# Patient Record
Sex: Female | Born: 1955 | Race: Black or African American | Hispanic: No | Marital: Single | Smoking: Never smoker
Health system: Southern US, Community
[De-identification: ages and names within clinical notes are randomized; demographics above are authoritative.]

## PROBLEM LIST (undated history)

## (undated) DIAGNOSIS — E785 Hyperlipidemia, unspecified: Secondary | ICD-10-CM

## (undated) DIAGNOSIS — E119 Type 2 diabetes mellitus without complications: Secondary | ICD-10-CM

## (undated) DIAGNOSIS — I10 Essential (primary) hypertension: Secondary | ICD-10-CM

## (undated) DIAGNOSIS — I2510 Atherosclerotic heart disease of native coronary artery without angina pectoris: Secondary | ICD-10-CM

## (undated) HISTORY — PX: CARDIAC CATHETERIZATION: SHX172

## (undated) HISTORY — PX: PARTIAL HYSTERECTOMY: SHX80

## (undated) HISTORY — DX: Hyperlipidemia, unspecified: E78.5

## (undated) HISTORY — DX: Type 2 diabetes mellitus without complications: E11.9

## (undated) HISTORY — PX: DILATION AND CURETTAGE OF UTERUS: SHX78

## (undated) HISTORY — DX: Atherosclerotic heart disease of native coronary artery without angina pectoris: I25.10

## (undated) HISTORY — DX: Essential (primary) hypertension: I10

---

## 1998-04-07 ENCOUNTER — Emergency Department (HOSPITAL_COMMUNITY): Admission: EM | Admit: 1998-04-07 | Discharge: 1998-04-07 | Payer: Self-pay | Admitting: Emergency Medicine

## 2005-11-07 ENCOUNTER — Ambulatory Visit: Payer: Self-pay

## 2005-11-28 ENCOUNTER — Other Ambulatory Visit: Payer: Self-pay

## 2005-12-02 ENCOUNTER — Ambulatory Visit: Payer: Self-pay | Admitting: Obstetrics and Gynecology

## 2006-04-15 ENCOUNTER — Emergency Department (HOSPITAL_COMMUNITY): Admission: EM | Admit: 2006-04-15 | Discharge: 2006-04-15 | Payer: Self-pay | Admitting: Emergency Medicine

## 2006-12-18 ENCOUNTER — Ambulatory Visit: Payer: Self-pay

## 2009-05-15 DIAGNOSIS — E1165 Type 2 diabetes mellitus with hyperglycemia: Secondary | ICD-10-CM | POA: Insufficient documentation

## 2009-05-15 DIAGNOSIS — E119 Type 2 diabetes mellitus without complications: Secondary | ICD-10-CM

## 2009-12-26 ENCOUNTER — Ambulatory Visit: Payer: Self-pay | Admitting: Family Medicine

## 2009-12-26 DIAGNOSIS — IMO0002 Reserved for concepts with insufficient information to code with codable children: Secondary | ICD-10-CM | POA: Insufficient documentation

## 2009-12-26 DIAGNOSIS — Z8249 Family history of ischemic heart disease and other diseases of the circulatory system: Secondary | ICD-10-CM | POA: Insufficient documentation

## 2009-12-26 DIAGNOSIS — E118 Type 2 diabetes mellitus with unspecified complications: Secondary | ICD-10-CM

## 2009-12-26 DIAGNOSIS — R079 Chest pain, unspecified: Secondary | ICD-10-CM | POA: Insufficient documentation

## 2009-12-26 DIAGNOSIS — E1165 Type 2 diabetes mellitus with hyperglycemia: Secondary | ICD-10-CM

## 2010-01-15 ENCOUNTER — Ambulatory Visit: Payer: Self-pay | Admitting: Internal Medicine

## 2010-01-16 LAB — CONVERTED CEMR LAB
BUN: 17 mg/dL (ref 6–23)
CO2: 20 meq/L (ref 19–32)
Chloride: 101 meq/L (ref 96–112)
Creatinine, Ser: 0.84 mg/dL (ref 0.40–1.20)
INR: 0.95 (ref <1.50)

## 2010-01-18 ENCOUNTER — Encounter: Payer: Self-pay | Admitting: Internal Medicine

## 2010-01-23 ENCOUNTER — Ambulatory Visit: Payer: Self-pay | Admitting: Cardiology

## 2010-01-23 ENCOUNTER — Ambulatory Visit (HOSPITAL_COMMUNITY): Admission: AD | Admit: 2010-01-23 | Discharge: 2010-01-24 | Payer: Self-pay | Admitting: Cardiology

## 2010-01-23 ENCOUNTER — Ambulatory Visit: Payer: Self-pay | Admitting: Internal Medicine

## 2010-01-23 ENCOUNTER — Inpatient Hospital Stay (HOSPITAL_BASED_OUTPATIENT_CLINIC_OR_DEPARTMENT_OTHER): Admission: RE | Admit: 2010-01-23 | Discharge: 2010-01-23 | Payer: Self-pay | Admitting: Internal Medicine

## 2010-01-24 DIAGNOSIS — I2510 Atherosclerotic heart disease of native coronary artery without angina pectoris: Secondary | ICD-10-CM

## 2010-02-06 ENCOUNTER — Telehealth: Payer: Self-pay | Admitting: Internal Medicine

## 2010-02-08 ENCOUNTER — Encounter: Payer: Self-pay | Admitting: Internal Medicine

## 2010-02-14 ENCOUNTER — Telehealth: Payer: Self-pay | Admitting: Internal Medicine

## 2010-02-15 ENCOUNTER — Ambulatory Visit: Payer: Self-pay | Admitting: Internal Medicine

## 2010-02-15 DIAGNOSIS — I10 Essential (primary) hypertension: Secondary | ICD-10-CM | POA: Insufficient documentation

## 2010-02-15 DIAGNOSIS — E782 Mixed hyperlipidemia: Secondary | ICD-10-CM

## 2010-02-15 DIAGNOSIS — I2510 Atherosclerotic heart disease of native coronary artery without angina pectoris: Secondary | ICD-10-CM

## 2010-03-12 ENCOUNTER — Encounter: Payer: Self-pay | Admitting: Internal Medicine

## 2010-10-17 ENCOUNTER — Ambulatory Visit
Admission: RE | Admit: 2010-10-17 | Discharge: 2010-10-17 | Payer: Self-pay | Source: Home / Self Care | Attending: Internal Medicine | Admitting: Internal Medicine

## 2010-10-17 ENCOUNTER — Encounter: Payer: Self-pay | Admitting: Internal Medicine

## 2010-10-25 NOTE — Miscellaneous (Signed)
Summary: No Show  No Show   Imported By: Harlon Flor 03/14/2010 08:40:12  _____________________________________________________________________  External Attachment:    Type:   Image     Comment:   External Document

## 2010-10-25 NOTE — Progress Notes (Signed)
Summary: HIVES  Phone Note Call from Patient Call back at Home Phone [phone]   Caller: SELF Call For: Kelly Manning Summary of Call: PT WOULD LIKE TO KNOW IF SHE CAN COME OFF OF HER MEDS UNTIL HER APPT WITH [facility] TOMORROW BECAUSE SHE HAS HAD HIVES FOR THE PAST 4 DAYS Initial call taken by: Harlon Flor,  Feb 14, 2010 2:04 PM  Follow-up for Phone Call        Recently started on ASA, Plavix, Imdur, simvastatin.  Pt states rash developed on arm 2 days after d/c, last 4 days itching, rash on hands, fingers, face, and arms.  Advised pt against stopping meds.  Per Dr Mariah Milling pt can take some Benedryl as needed and Pepcid as needed to help relieve symptoms.  Dr Mariah Milling suggested maybe switching the Plavix to Effient and/or the simvastatin to Crestor to see if either med is causing rash.  Will let Dr Gala Romney address at [facility] tomorrow.   Follow-up by: Cloyde Reams RN,  Feb 14, 2010 5:17 PM     Appended Document: HIVES Plavix stopped. Switched to [employer]

## 2010-10-25 NOTE — Assessment & Plan Note (Signed)
Summary: NEW PT   Referring Provider:  Dr. Katrinka Blazing Primary Provider:  Reuben Likes  CC:  Chest tightness x 4 days ago; SOB.  History of Present Illness: 55 y/o woman with h/o DM2 x 6 years but no h/o HTN or hyperlipidemia.  No known h/o heart disease. Never had stress test or cath.  Over past 3 months, notes that when she walks gets a tightness in her chest. Once she sits down it oges away in about 5 minutes. Happens about 1-3x/week. Happens only when she is walking. Never at rest. Feels she is more short of breath with activity. CP now seems to be coming on earlier then before. Has not had nocturnal angina. No bleeding, melena or BRBPR.   Has 11 siblings. 1 brother had CAD with stent at 46. Another brother had MI in 41s. 1 sister died of MI at age 19. Mother also had CAD.  Current Medications (verified): 1)  Lisinopril 5 Mg Tabs (Lisinopril) .... Take One Tablet By Mouth Daily 2)  Janumet 50-1000 Mg Tabs (Sitagliptin-Metformin Hcl) .... Take 1 By Mouth Two Times A Day 3)  Aspirin 81 Mg Tbec (Aspirin) .... Take One Tablet By Mouth Daily  Allergies (verified): No Known Drug Allergies  Past History:  Past Medical History: Last updated: Jan 10, 2010 Diabetes Type 2  Past Surgical History: Last updated: 01/10/10 partial hysterectomy D&C  Family History: Last updated: Jan 10, 2010 Father: deceased 65: sucide Mother: deceased 1: DM, ovarian cancer, CAD stents  Social History: Last updated: 01/10/10 Full Time Single  Tobacco Use - No.  Alcohol Use - no Regular Exercise - yes Drug Use - no  Risk Factors: Exercise: yes (01/10/10)  Risk Factors: Smoking Status: never (2010-01-10)  Family History: Reviewed history from 01-10-10 and no changes required. Father: deceased 19: sucide Mother: deceased 16: DM, ovarian cancer, CAD stents  Social History: Reviewed history from 01/10/2010 and no changes required. Full Time Single  Tobacco Use - No.  Alcohol Use -  no Regular Exercise - yes Drug Use - no  Review of Systems       As per HPI and past medical history; otherwise all systems negative.   Vital Signs:  Patient profile:   55 year old female Height:      66 inches Weight:      185 pounds BMI:     29.97 Pulse rate:   96 / minute Pulse rhythm:   regular BP sitting:   118 / 78  (left arm)  Vitals Entered By: Stanton Kidney, EMT-P (January 15, 2010 2:58 PM)  Physical Exam  General:  Gen: well appearing. no resp difficulty HEENT: normal Neck: supple. no JVD. Carotids 2+ bilat; no bruits. No lymphadenopathy or thryomegaly appreciated. Cor: PMI nondisplaced. Regular rate & rhythm. No rubs, gallops, murmur. Lungs: clear Abdomen: soft, nontender, nondistended. No hepatosplenomegaly. No bruits or masses. Good bowel sounds. Extremities: no cyanosis, clubbing, rash, edema Neuro: alert & orientedx3, cranial nerves grossly intact. moves all 4 extremities w/o difficulty. affect pleasant    Impression & Recommendations:  Problem # 1:  CHEST PAIN-UNSPECIFIED (ICD-786.50) Assessment Unchanged Her symptoms are very concerning for prgressive angina - especially in light of her risk factors and Fhx.  We have discussed stress testing vs cath and I have strongly recommended cath. We went over the risks and she agrees to proceed. Will schedule for next week.  Will continue ASA. Start Imdur. We discussed that if she has worsening symptoms need to call 911. Start zocor 20.  Other Orders: T-CBC No Diff ([id number]) T-Basic Metabolic Panel ([id number]) T-Protime, Auto ([id number]) Cardiac Catheterization (Cardiac Cath)  Patient Instructions: 1)  Your physician recommends that you schedule a follow-up appointment in: 1 month 2)  Your physician has recommended you make the following change in your medication: aspirin 325 mg, imdur 30 mg daily, simvastatin 20 mg daily 3)  Your physician has requested that you have a cardiac catheterization.  Cardiac  catheterization is used to diagnose and/or treat various heart conditions. Doctors may recommend this procedure for a number of different reasons. The most common reason is to evaluate chest pain. Chest pain can be a symptom of coronary artery disease (CAD), and cardiac catheterization can show whether plaque is narrowing or blocking your heart's arteries. This procedure is also used to evaluate the valves, as well as measure the blood flow and oxygen levels in different parts of your heart.  For further information please visit [email or web address]  Please follow instruction sheet, as given. Prescriptions: SIMVASTATIN 20 MG TABS (SIMVASTATIN) 1 tab daily at bedtime  #30 x 6   Entered by:   Charlena Cross, RN, BSN   Authorized by:   Dolores Patty, MD, [facility]   Signed by:   Charlena Cross, RN, BSN on 01/15/2010   Method used:   [facility] to        [facility] [phone]* (retail)       [address] [place], [place]  [address]       Ph: [phone] or [phone]       Fax: [phone]   RxID:   [id number] IMDUR 30 MG XR24H-TAB (ISOSORBIDE MONONITRATE) 1 tab by mouth daily  #30 x 6   Entered by:   Charlena Cross, RN, BSN   Authorized by:   Dolores Patty, MD, [facility]   Signed by:   Charlena Cross, RN, BSN on 01/15/2010   Method used:   Electronically to        [facility] [phone]* (retail)       [address] [place], [place]  [address]       Ph: [phone] or [phone]       Fax: [phone]   RxID:   [id number]

## 2010-10-25 NOTE — Progress Notes (Signed)
Summary: Return to work  Phone Note Call from Patient   Caller: Patient Call For: Bensimhon Summary of Call: Pt dropped off paperwork for her job.  Pt has to be cleared by Dr Gala Romney and MD at her work before returning to her job at [employer].  Pt feels she is ready and able to return to her job on 02/12/10, but needs paperwork signed stating OK with Dr Gala Romney to return to work.  Please advise if OK to return to work on 02/12/10.  Pt needs to know by 5/19/11so that she can schedule CPX with MD at [facility].  Please advise.  Thanks. Initial call taken by: Cloyde Reams RN,  Feb 06, 2010 4:14 PM  Follow-up for Phone Call        Need to understand job duties before we can clear fully. should be ok though. 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 Please advise if OK to return to work.  Follow-up by: Cloyde Reams RN,  Feb 07, 2010 11:40 AM  Additional Follow-up for Phone Call Additional follow up Details #1::        ok to go back to work. Dolores Patty, MD, [facility]  Feb 07, 2010 6:11 PM   Called spoke with pt, made aware OK per Dr Gala Romney to return to work on 02/12/10.  Will complete paperwork for pt to pick-up.  Additional Follow-up by: Cloyde Reams RN,  Feb 08, 2010 8:41 AM

## 2010-10-25 NOTE — Assessment & Plan Note (Signed)
Summary: Kelly Manning   Visit Type:  Follow-up Referring Provider:  Dr. Katrinka Blazing Primary Provider:  Reuben Likes  CC:  no cardiac complaints.  History of Present Illness: 55 y/o woman with h/o DM2 x 6 years but no h/o HTN or hyperlipidemia.  I recently saw her in consult for CP and she underwent cardiac cath which showed EF 55% with 90-95% lesion in LAD. RCA and LCX normal. Underwent Promus DES to LAD and balloon angioplasty of the diagonal.  Returns for post-cath f/u.   Doing very well.  CP and dyspnea resolved. No problems with groin site. Back to work without difficulty. Have hives and pruritic rash and relates it to Plavix. [facility].   Current Medications (verified): 1)  Lisinopril 5 Mg Tabs (Lisinopril) .... Take One Tablet By Mouth Daily 2)  Janumet 50-1000 Mg Tabs (Sitagliptin-Metformin Hcl) .... Take 1 By Mouth Two Times A Day 3)  Aspirin Ec 325 Mg Tbec (Aspirin) .... Take One Tablet By Mouth Daily 4)  Imdur 30 Mg Xr24h-Tab (Isosorbide Mononitrate) .Marland Kitchen.. 1 Tab By Mouth Daily 5)  Simvastatin 20 Mg Tabs (Simvastatin) .Marland Kitchen.. 1 Tab Daily At Bedtime 6)  Plavix 75 Mg Tabs (Clopidogrel Bisulfate) .... Take One Tablet By Mouth Daily  Allergies (verified): 1)  ! Plavix (Clopidogrel Bisulfate)  Vital Signs:  Patient profile:   55 year old female Height:      66 inches Weight:      187 pounds BMI:     30.29 Pulse rate:   85 / minute BP sitting:   146 / 80  (left arm) Cuff size:   regular  Vitals Entered By: Kelly Manning, RMA (Feb 15, 2010 3:50 PM)  Physical Exam  General:  Gen: well appearing. no resp difficulty HEENT: normal Neck: supple. no JVD. Carotids 2+ bilat; no bruits. No lymphadenopathy or thryomegaly appreciated. Cor: PMI nondisplaced. Regular rate & rhythm. No rubs, gallops, 2/6 murmur at RSB. Lungs: clear Abdomen: soft, nontender, nondistended. No hepatosplenomegaly. No bruits or masses. Good bowel sounds. Extremities: no cyanosis,  clubbing, edema. macular rash on arm. groin ok without bruit Neuro: alert & orientedx3, cranial nerves grossly intact. moves all 4 extremities w/o difficulty. affect pleasant    Impression & Recommendations:  Problem # 1:  CAD, NATIVE VESSEL (ICD-414.01) Doing well s/p recent stenting of LAD. Having allergic reaction likely to Plavix. Will change to Effient 10 once daily. Encouraged her to f/u with cardiac rehab  Problem # 2:  HYPERTENSION, BENIGN (ICD-401.1) BP is up today which is unusual for her. If SBP remains > 140 I asked her to increase her lisinopril to 10. F/u with Dr. Katrinka Blazing.   Appended Document: Kelly Manning    Clinical Lists Changes  Medications: Removed medication of PLAVIX 75 MG TABS (CLOPIDOGREL BISULFATE) Take one tablet by mouth daily Added new medication of EFFIENT 10 MG TABS (PRASUGREL HCL) Take one tablet by mouth daily - Signed Rx of EFFIENT 10 MG TABS (PRASUGREL HCL) Take one tablet by mouth daily;  #30 x 6;  Signed;  Entered by: Cloyde Reams RN;  Authorized by: Dolores Patty, MD, [facility];  Method used: [facility] to [facility] [phone]*, [address] [place], [place], [place]  [address], Ph: [phone] or [phone], Fax: [phone]    Prescriptions: EFFIENT 10 MG TABS (PRASUGREL HCL) Take one tablet by mouth daily  #30 x 6   Entered by:   Cloyde Reams RN   Authorized by:  Dolores Patty, MD, [facility]   Signed by:   Cloyde Reams RN on 02/15/2010   Method used:   Electronically to        [facility] [phone]* (retail)       [address] [place], [place]  [address]       Ph: [phone] or [phone]       Fax: [phone]   RxID:   [id number]

## 2010-10-25 NOTE — Progress Notes (Signed)
Summary: PHI  PHI   Imported By: Harlon Flor 01/16/2010 15:04:08  _____________________________________________________________________  External Attachment:    Type:   Image     Comment:   External Document

## 2010-10-25 NOTE — Letter (Signed)
Summary: FMLA  FMLA   Imported By: Harlon Flor 02/08/2010 12:00:40  _____________________________________________________________________  External Attachment:    Type:   Image     Comment:   External Document

## 2010-10-25 NOTE — Assessment & Plan Note (Signed)
Summary: F6M/SAB   Visit Type:  Follow-up Referring Provider:  Dr. Katrinka Blazing Primary Provider:  Reuben Likes  CC:  "Doing well"..  History of Present Illness: Kelly Manning is a 55 y/o woman with h/o DM2 x 6 years but no h/o HTN or hyperlipidemia.  I recently saw her in consult for CP and she underwent cardiac cath in 5/11 which showed EF 55% with 90-95% lesion in LAD. RCA and LCX normal. Underwent Promus DES to LAD and balloon angioplasty of the diagonal.  Returns for f/u.   Doing very well.  Not going to cardiac rehab. Walks with her sister every afternoon. Typically 30 mins per day. No CP or undue dyspnea. Had a rash with Plavix and had to switch to Effient. very compliant with it. Does note some bruising but not severe.  Lipids followed by Dr. Katrinka Blazing.   Current Medications (verified): 1)  Lisinopril 5 Mg Tabs (Lisinopril) .... Take One Tablet By Mouth Daily 2)  Janumet 50-1000 Mg Tabs (Sitagliptin-Metformin Hcl) .... Take 1 By Mouth Two Times A Day 3)  Aspirin Ec 325 Mg Tbec (Aspirin) .... Take One Tablet By Mouth Daily 4)  Imdur 30 Mg Xr24h-Tab (Isosorbide Mononitrate) .Marland Kitchen.. 1 Tab By Mouth Daily 5)  Simvastatin 20 Mg Tabs (Simvastatin) .Marland Kitchen.. 1 Tab Daily At Bedtime 6)  Effient 10 Mg Tabs (Prasugrel Hcl) .... Take One Tablet By Mouth Daily  Allergies (verified): 1)  ! Plavix (Clopidogrel Bisulfate)  Past History:  Past Surgical History: Last updated: 01-22-10 partial hysterectomy D&C  Family History: Last updated: 01-22-10 Father: deceased 38: sucide Mother: deceased 54: DM, ovarian cancer, CAD stents  Social History: Last updated: 01-22-10 Full Time Single  Tobacco Use - No.  Alcohol Use - no Regular Exercise - yes Drug Use - no  Risk Factors: Exercise: yes (01-22-2010)  Risk Factors: Smoking Status: never (01/22/10)  Past Medical History: CAD   --s/p DES to LAD in May 2011 Plavix allergy (rash) Diabetes Type 2  Review of Systems       As per HPI and  past medical history; otherwise all systems negative.   Vital Signs:  Patient profile:   55 year old female Height:      66 inches Weight:      188 pounds BMI:     30.45 Pulse rate:   80 / minute BP sitting:   100 / 70  (left arm) Cuff size:   regular  Vitals Entered By: Bishop Dublin, CMA (October 17, 2010 11:05 AM)  Physical Exam  General:  well appearing. no resp difficulty HEENT: normal Neck: supple. no JVD. Carotids 2+ bilat; no bruits. No lymphadenopathy or thryomegaly appreciated. Cor: PMI nondisplaced. Regular rate & rhythm. No rubs, gallops, 2/6 murmur at RSB. Lungs: clear Abdomen: soft, nontender, nondistended. No hepatosplenomegaly. No bruits or masses. Good bowel sounds. Extremities: no cyanosis, clubbing, edema. macular rash on arm. groin ok without bruit Neuro: alert & orientedx3, cranial nerves grossly intact. moves all 4 extremities w/o difficulty. affect pleasant    Impression & Recommendations:  Problem # 1:  CAD, NATIVE VESSEL (ICD-414.01) Doing very well. No angina. Continue Effient for 1 year (May). Can decrease ASA to 81. Stop Imdure.  Problem # 2:  HYPERLIPIDEMIA TYPE IIB / III (ICD-272.2) Followed by Dr. Katrinka Blazing. Goal LDL < 70. Titrate as needed.  Patient Instructions: 1)  Your physician recommends that you schedule a follow-up appointment in: 6 months 2)  Your physician has recommended you make the following change in your medication:  STOP Imdur. DECREASE Aspirin to 81mg  once daily.

## 2010-10-25 NOTE — Letter (Signed)
Summary: Cardiac Catheterization Instructions- [facility]  [facility] at [address]   [place], [place] [address]   Phone: [phone]  Fax: [phone]     01/15/2010 MRN: [id number]  [facility] [address] [place], [place]  [address]  Dear Ms. Svec,   You are scheduled for a Cardiac Catheterization on 01/23/10 with Dr.Bret Stamour.   Please arrive to the 1st floor of the Heart and Vascular Center at [facility] at 7:30 am on the day of your procedure. Please do not arrive before 6:30 a.m. Call the Heart and Vascular Center at [phone] if you are unable to make your appointmnet. The Code to get into the parking garage under the building is 0010. Take the elevators to the 1st floor. You must have someone to drive you home. Someone must be with you for the first 24 hours after you arrive home. Please wear clothes that are easy to get on and off and wear slip-on shoes. Do not eat or drink after midnight except water with your medications that morning. Bring all your medications and current insurance cards with you.  ___ DO NOT take these medications before your procedure: ________________________________________________________________  ___ Make sure you take your aspirin.  ___ You may take ALL of your medications with water that morning. ________________________________________________________________________________________________________________________________  ___ DO NOT take ANY medications before your procedure.  ___ Pre-med instructions:  ________________________________________________________________________________________________________________________________  The usual length of stay after your procedure is 2 to 3 hours. This can vary.  If you have any questions, please call the office at the number listed above.   Charlena Cross, RN, BSN

## 2010-12-11 LAB — GLUCOSE, CAPILLARY
Glucose-Capillary: 151 mg/dL — ABNORMAL HIGH (ref 70–99)
Glucose-Capillary: 161 mg/dL — ABNORMAL HIGH (ref 70–99)
Glucose-Capillary: 180 mg/dL — ABNORMAL HIGH (ref 70–99)
Glucose-Capillary: 210 mg/dL — ABNORMAL HIGH (ref 70–99)

## 2010-12-11 LAB — BASIC METABOLIC PANEL
CO2: 24 mEq/L (ref 19–32)
Calcium: 9.1 mg/dL (ref 8.4–10.5)
Creatinine, Ser: 0.74 mg/dL (ref 0.4–1.2)
GFR calc Af Amer: >60 mL/min (ref >60)

## 2010-12-11 LAB — CBC
MCHC: 34.5 g/dL (ref 30.0–36.0)
RBC: 4.33 MIL/uL (ref 3.87–5.11)

## 2011-03-20 ENCOUNTER — Ambulatory Visit: Payer: Self-pay | Admitting: Family Medicine

## 2011-03-20 LAB — HM MAMMOGRAPHY

## 2011-04-17 ENCOUNTER — Encounter: Payer: Self-pay | Admitting: Internal Medicine

## 2011-04-24 ENCOUNTER — Encounter: Payer: Self-pay | Admitting: Internal Medicine

## 2011-05-06 LAB — HM COLONOSCOPY

## 2011-05-08 ENCOUNTER — Encounter: Payer: Self-pay | Admitting: *Deleted

## 2011-05-08 ENCOUNTER — Ambulatory Visit (INDEPENDENT_AMBULATORY_CARE_PROVIDER_SITE_OTHER): Payer: BC Managed Care – PPO | Admitting: Internal Medicine

## 2011-05-08 ENCOUNTER — Encounter: Payer: Self-pay | Admitting: Internal Medicine

## 2011-05-08 VITALS — BP 132/84 | HR 83 | Ht 66.0 in | Wt 186.0 lb

## 2011-05-08 DIAGNOSIS — R0989 Other specified symptoms and signs involving the circulatory and respiratory systems: Secondary | ICD-10-CM

## 2011-05-08 DIAGNOSIS — E782 Mixed hyperlipidemia: Secondary | ICD-10-CM

## 2011-05-08 DIAGNOSIS — R0600 Dyspnea, unspecified: Secondary | ICD-10-CM

## 2011-05-08 DIAGNOSIS — I2510 Atherosclerotic heart disease of native coronary artery without angina pectoris: Secondary | ICD-10-CM

## 2011-05-08 NOTE — Progress Notes (Signed)
HPI:  Kelly Manning is a 55 y/o woman with h/o DM2 and CAD. Underwent cardiac cath in 5/11 for CP which showed EF 55% with 90-95% lesion in LAD. RCA and LCX normal. Underwent Promus DES to LAD and balloon angioplasty of the diagonal.  Returns for f/u.   Doing fairly well. Stopped Effient (had rash with Plavix) in June. Notes if she bends over to tie her shoe or walks up steps gets SOB. However, continues to walk with her sister 2-3x/week without CP. Also can push mow her lawn without CP. No wheezing or edema. No weight gain.    Lipids followed by Dr. Katrinka Blazing - told her numbers were outstanding.     ROS: All systems negative except as listed in HPI, PMH and Problem List.  Past Medical History  Diagnosis Date  . CAD (coronary artery disease)     -s/p DES to LAD in May 2011  . DM2 (diabetes mellitus, type 2)     Current Outpatient Prescriptions  Medication Sig Dispense Refill  . aspirin 81 MG tablet Take 81 mg by mouth daily.        Marland Kitchen glipiZIDE (GLUCOTROL) 5 MG tablet Take 5 mg by mouth 2 (two) times daily before a meal.        . lisinopril (PRINIVIL,ZESTRIL) 5 MG tablet Take 5 mg by mouth daily.        . simvastatin (ZOCOR) 20 MG tablet Take 20 mg by mouth at bedtime.        . sitaGLIPtan-metformin (JANUMET) 50-1000 MG per tablet Take 1 tablet by mouth 2 (two) times daily.           PHYSICAL EXAM: Filed Vitals:   05/08/11 1416  BP: 132/84  Pulse: 83   General:  Well appearing. No resp difficulty HEENT: normal Neck: supple. JVP flat. Carotids 2+ bilaterally; no bruits. No lymphadenopathy or thryomegaly appreciated. Cor: PMI normal. Regular rate & rhythm. No rubs, gallops or murmurs. Lungs: clear Abdomen: soft, nontender, nondistended. No hepatosplenomegaly. No bruits or masses. Good bowel sounds. Extremities: no cyanosis, clubbing, rash, edema Neuro: alert & orientedx3, cranial nerves grossly intact. Moves all 4 extremities w/o difficulty. Affect pleasant.    ECG: NSR 78 No ST-T  wave abnormalities.     ASSESSMENT & PLAN:

## 2011-05-08 NOTE — Assessment & Plan Note (Signed)
Overall seems to be doing well but does have mild intermittent dyspnea which I suspect is related to a decline in her fitness. However, given recent LAD stent would favor ETT/Myoview to exclude recurrent ischemia. Otherwise continue current regimen. I encouraged her to do a bit more walking on a regular basis.

## 2011-05-08 NOTE — Assessment & Plan Note (Signed)
Followed by Dr. Katrinka Blazing. Goal LDL < 70. Continue statin.

## 2011-05-08 NOTE — Patient Instructions (Signed)
You are scheduled for a stress test myoview at [facility], please refer to instructions given today.

## 2011-05-10 ENCOUNTER — Encounter: Payer: Self-pay | Admitting: *Deleted

## 2011-05-16 ENCOUNTER — Ambulatory Visit: Payer: Self-pay | Admitting: Internal Medicine

## 2011-05-16 DIAGNOSIS — R079 Chest pain, unspecified: Secondary | ICD-10-CM

## 2011-05-21 ENCOUNTER — Telehealth: Payer: Self-pay | Admitting: *Deleted

## 2011-05-21 NOTE — Telephone Encounter (Signed)
Pt called to get myoview results. Notified pt low risk scan, no ischemia noted ,normal wall motion, no ekg changes. Pt will f/u as needed.

## 2011-07-01 ENCOUNTER — Encounter: Payer: Self-pay | Admitting: Internal Medicine

## 2011-07-05 ENCOUNTER — Emergency Department (HOSPITAL_COMMUNITY)
Admission: EM | Admit: 2011-07-05 | Discharge: 2011-07-05 | Disposition: A | Discharge status: Home / Self Care | Payer: Worker's Compensation | Attending: Emergency Medicine | Admitting: Emergency Medicine

## 2011-07-05 ENCOUNTER — Emergency Department (HOSPITAL_COMMUNITY): Payer: Worker's Compensation

## 2011-07-05 DIAGNOSIS — Y9289 Other specified places as the place of occurrence of the external cause: Secondary | ICD-10-CM | POA: Insufficient documentation

## 2011-07-05 DIAGNOSIS — IMO0001 Reserved for inherently not codable concepts without codable children: Secondary | ICD-10-CM | POA: Insufficient documentation

## 2011-07-05 DIAGNOSIS — W319XXA Contact with unspecified machinery, initial encounter: Secondary | ICD-10-CM | POA: Insufficient documentation

## 2011-07-05 DIAGNOSIS — E119 Type 2 diabetes mellitus without complications: Secondary | ICD-10-CM | POA: Insufficient documentation

## 2011-07-05 DIAGNOSIS — S6720XA Crushing injury of unspecified hand, initial encounter: Secondary | ICD-10-CM | POA: Insufficient documentation

## 2011-07-05 DIAGNOSIS — M79609 Pain in unspecified limb: Secondary | ICD-10-CM | POA: Insufficient documentation

## 2011-07-05 DIAGNOSIS — M7989 Other specified soft tissue disorders: Secondary | ICD-10-CM | POA: Insufficient documentation

## 2012-01-13 ENCOUNTER — Other Ambulatory Visit: Payer: Self-pay | Admitting: Internal Medicine

## 2012-01-13 NOTE — Telephone Encounter (Signed)
Pt was last seen in august by Dr Gala Romney and was told to f/u in a year but she is out of her meds. Please send refill to [facility] to get her through to aug appt.

## 2012-01-14 ENCOUNTER — Other Ambulatory Visit: Payer: Self-pay | Admitting: Internal Medicine

## 2012-01-14 MED ORDER — SIMVASTATIN 20 MG PO TABS: 20.0000 mg | ORAL_TABLET | Freq: Every day | ORAL | Status: DC

## 2012-01-14 NOTE — Telephone Encounter (Signed)
Refilled simvastatin (ZOCOR) 20 MG tablet.

## 2012-05-08 ENCOUNTER — Institutional Professional Consult (permissible substitution): Payer: Self-pay | Admitting: Cardiovascular Disease

## 2012-05-15 ENCOUNTER — Encounter: Payer: Self-pay | Admitting: Cardiovascular Disease

## 2012-05-15 ENCOUNTER — Ambulatory Visit (INDEPENDENT_AMBULATORY_CARE_PROVIDER_SITE_OTHER): Payer: BC Managed Care – PPO | Admitting: Cardiovascular Disease

## 2012-05-15 VITALS — BP 118/80 | HR 80 | Ht 66.5 in | Wt 195.5 lb

## 2012-05-15 DIAGNOSIS — E782 Mixed hyperlipidemia: Secondary | ICD-10-CM

## 2012-05-15 DIAGNOSIS — I10 Essential (primary) hypertension: Secondary | ICD-10-CM

## 2012-05-15 DIAGNOSIS — E119 Type 2 diabetes mellitus without complications: Secondary | ICD-10-CM

## 2012-05-15 DIAGNOSIS — I2510 Atherosclerotic heart disease of native coronary artery without angina pectoris: Secondary | ICD-10-CM

## 2012-05-15 MED ORDER — ATORVASTATIN CALCIUM 40 MG PO TABS: 40.0000 mg | ORAL_TABLET | Freq: Every day | ORAL | Status: DC

## 2012-05-15 NOTE — Patient Instructions (Addendum)
You are doing well. When you run out of simvastatin (2 a day), Please change to atorvastatin 40 mg daily  Please call us if you have new issues that need to be addressed before your next appt.  Your physician wants you to follow-up in: 12 months.  You will receive a reminder letter in the mail two months in advance. If you don't receive a letter, please call our office to schedule the follow-up appointment.

## 2012-05-15 NOTE — Assessment & Plan Note (Signed)
Blood pressure is well controlled on today's visit. No changes made to the medications. 

## 2012-05-15 NOTE — Assessment & Plan Note (Signed)
Currently with no symptoms of angina. No further workup at this time. Continue current medication regimen. 

## 2012-05-15 NOTE — Progress Notes (Signed)
Patient ID: Kelly Manning, female    DOB: 1956-01-23, 56 y.o.   MRN: [id number]  HPI Comments: Kelly Manning is a 56 y/o woman with h/o DM2, hyperlipidemia, CAD with stent to her LAD in May 2011, who presents for routine followup.     CP in 2011, cardiac cath  which showed EF 55% with 90-95% lesion in LAD. RCA and LCX normal. Underwent Promus DES to LAD and balloon angioplasty of the diagonal.  Doing very well.  He denies any significant chest pain. No shortness of breath. She works third shift. Exercising sometimes. Sugar levels are "up and down".   Stress test August 2012 showing no perfusion abnormality, low risk scan, normal ejection fraction EKG shows normal sinus rhythm with rate 80 beats per minute with no significant ST or T wave changes Recent lab work shows total cholesterol greater than 170, LDL in the 90 range       Outpatient Encounter Prescriptions as of 05/15/2012  Medication Sig Dispense Refill  . aspirin 81 MG tablet Take 81 mg by mouth daily.        Marland Kitchen glipiZIDE (GLUCOTROL XL) 10 MG 24 hr tablet Take 10 mg by mouth daily.      Marland Kitchen lisinopril (PRINIVIL,ZESTRIL) 5 MG tablet Take 5 mg by mouth daily.        . simvastatin (ZOCOR) 20 MG tablet Take 1 tablet (20 mg total) by mouth at bedtime.  30 tablet  4  . sitaGLIPtan-metformin (JANUMET) 50-1000 MG per tablet Take 1 tablet by mouth 2 (two) times daily.        Marland Kitchen DISCONTD: glipiZIDE (GLUCOTROL) 5 MG tablet Take 5 mg by mouth 2 (two) times daily before a meal.          Review of Systems  Constitutional: Negative.   HENT: Negative.   Eyes: Negative.   Respiratory: Negative.   Cardiovascular: Negative.   Gastrointestinal: Negative.   Musculoskeletal: Negative.   Skin: Negative.   Neurological: Negative.   Hematological: Negative.   Psychiatric/Behavioral: Negative.   All other systems reviewed and are negative.     BP 118/80  Pulse 80  Ht 5' 6.5" (1.689 m)  Wt 195 lb 8 oz (88.678 kg)  BMI 31.08 kg/m2  Physical  Exam  Nursing note and vitals reviewed. Constitutional: She is oriented to person, place, and time. She appears well-developed and well-nourished.  HENT:  Head: Normocephalic.  Nose: Nose normal.  Mouth/Throat: Oropharynx is clear and moist.  Eyes: Conjunctivae are normal. Pupils are equal, round, and reactive to light.  Neck: Normal range of motion. Neck supple. No JVD present.  Cardiovascular: Normal rate, regular rhythm, S1 normal, S2 normal, normal heart sounds and intact distal pulses.  Exam reveals no gallop and no friction rub.   No murmur heard. Pulmonary/Chest: Effort normal and breath sounds normal. No respiratory distress. She has no wheezes. She has no rales. She exhibits no tenderness.  Abdominal: Soft. Bowel sounds are normal. She exhibits no distension. There is no tenderness.  Musculoskeletal: Normal range of motion. She exhibits no edema and no tenderness.  Lymphadenopathy:    She has no cervical adenopathy.  Neurological: She is alert and oriented to person, place, and time. Coordination normal.  Skin: Skin is warm and dry. No rash noted. No erythema.  Psychiatric: She has a normal mood and affect. Her behavior is normal. Judgment and thought content normal.         Assessment and Plan

## 2012-05-15 NOTE — Assessment & Plan Note (Signed)
We have encouraged continued exercise, careful diet management in an effort to lose weight. 

## 2012-05-15 NOTE — Assessment & Plan Note (Signed)
Cholesterol is not at goal. We have suggested she hold her simvastatin and start Lipitor 40 mg daily. Goal LDL less than 70.

## 2012-10-29 LAB — HM HEPATITIS C SCREENING LAB: HM Hepatitis Screen: NEGATIVE

## 2012-12-11 ENCOUNTER — Other Ambulatory Visit: Payer: Self-pay | Admitting: Family Medicine

## 2013-07-14 IMAGING — CR DG HAND COMPLETE 3+V*L*
3 series · 3 of 3 positions shown · non-contrast
Comparison: None.

CLINICAL DATA: Injured left hand, pain localizing to the long
finger and the first MCP joint.

LEFT HAND - COMPLETE 3+ VIEW 07/05/2011:

[x hand pa left]
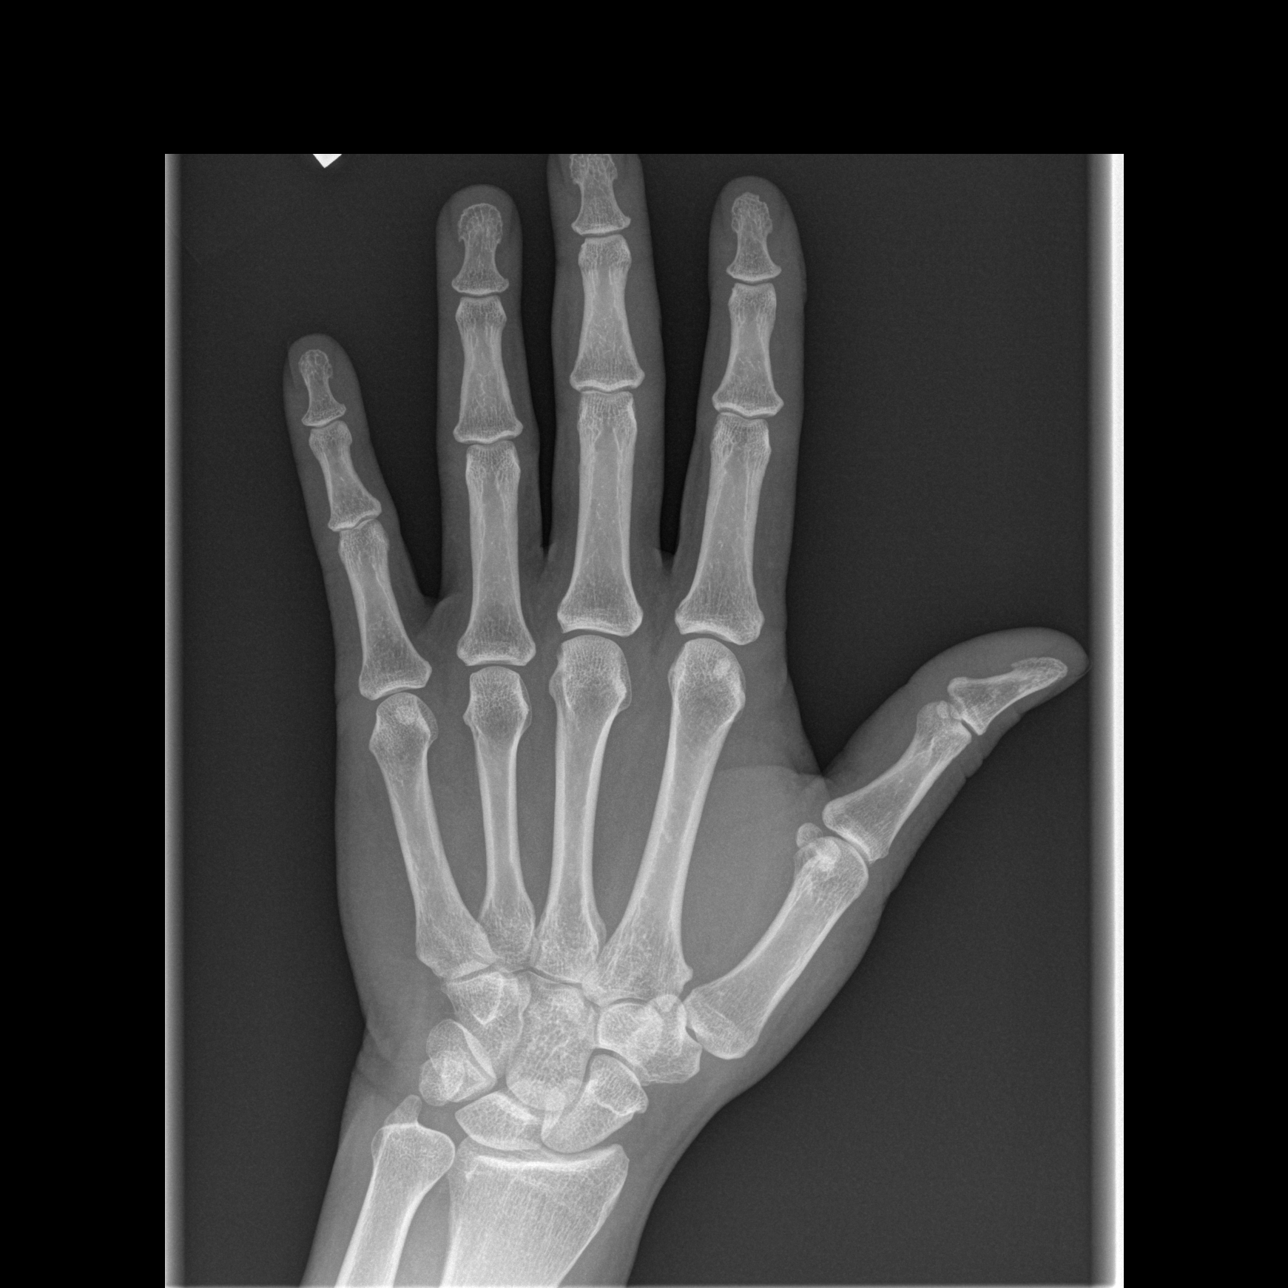

[x hand oblique left]
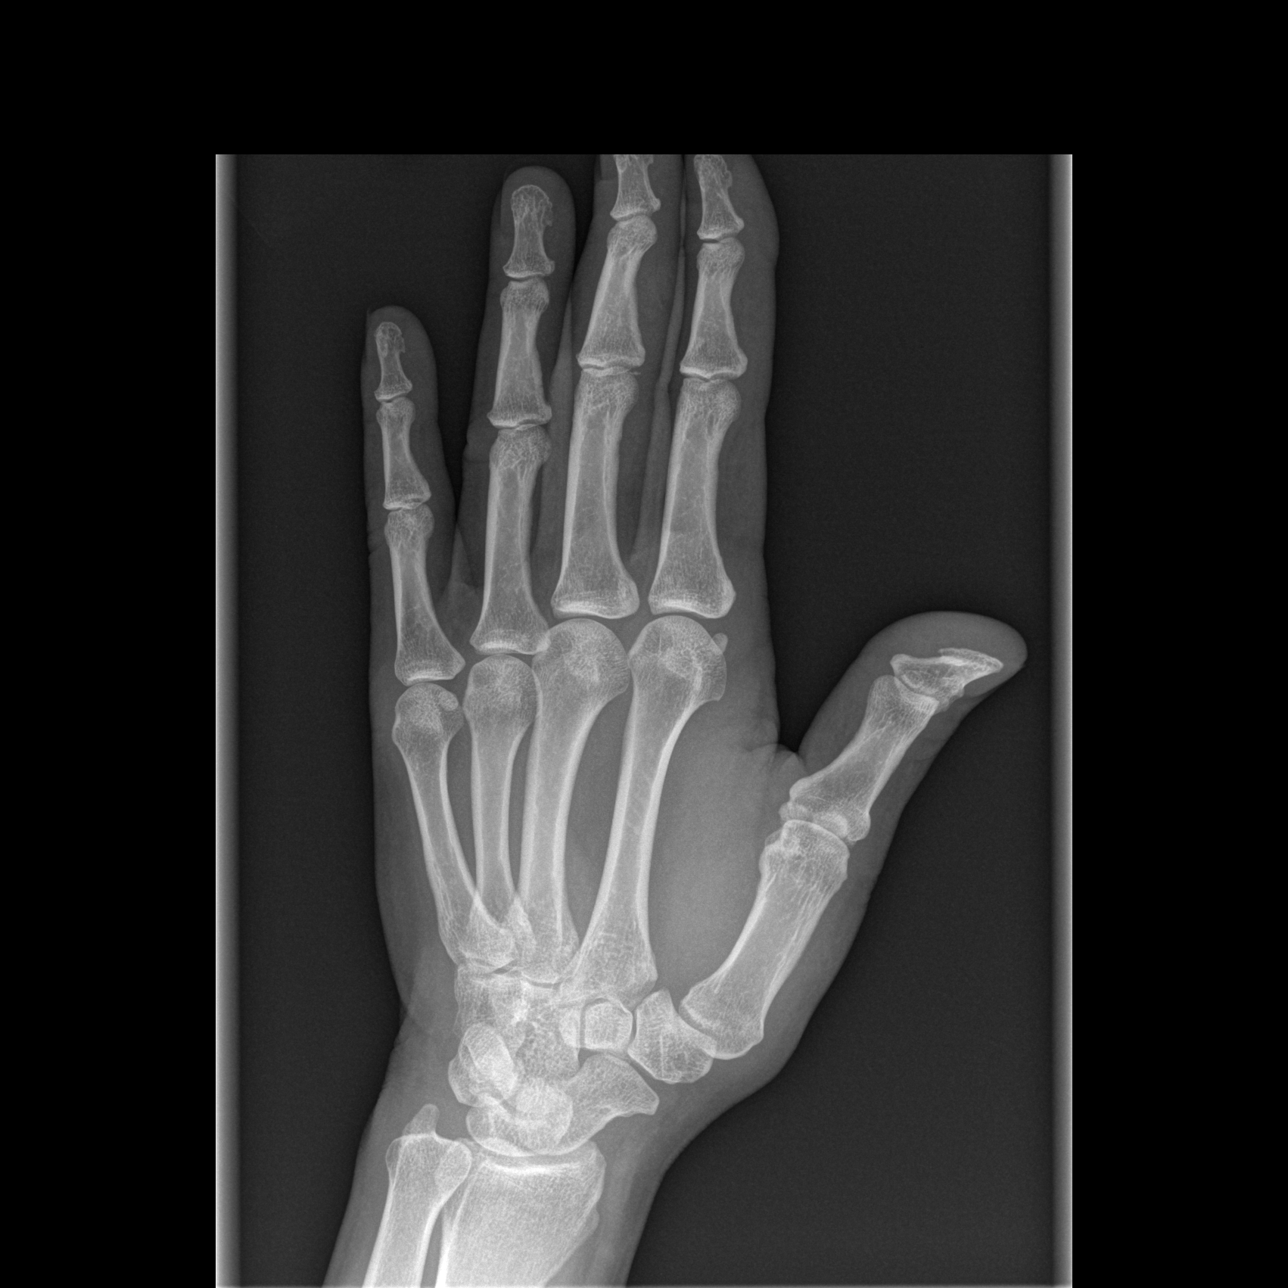

[x hand lat left]
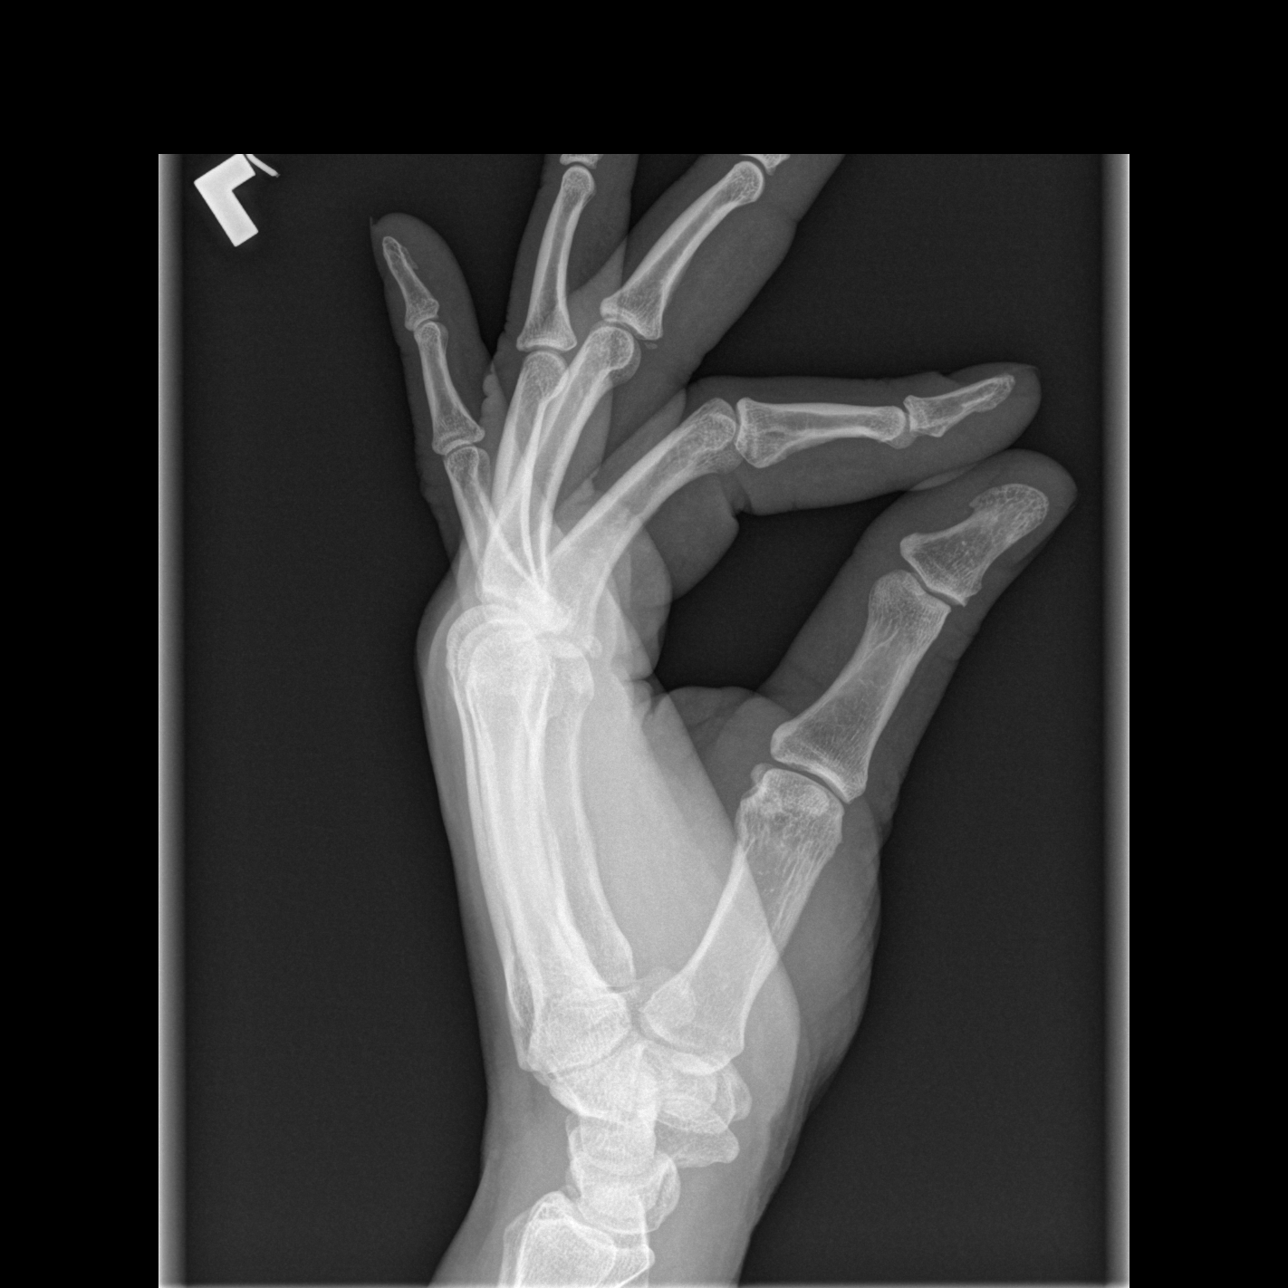

[3 of 3 positions shown; findings below may reference images not displayed]

FINDINGS: Small avulsion fracture arising from the base of the
middle phalanx of the long finger along its volar surface.  No
other fractures.  Well-preserved joint spaces.  Well-preserved bone
mineral density.
IMPRESSION: Volar plate fracture involving the base of the middle phalanx of
the long finger.

## 2013-08-09 ENCOUNTER — Ambulatory Visit: Payer: Self-pay | Admitting: Cardiovascular Disease

## 2013-08-23 ENCOUNTER — Encounter (INDEPENDENT_AMBULATORY_CARE_PROVIDER_SITE_OTHER): Payer: Self-pay

## 2013-08-23 ENCOUNTER — Ambulatory Visit (INDEPENDENT_AMBULATORY_CARE_PROVIDER_SITE_OTHER): Payer: BC Managed Care – PPO | Admitting: Cardiovascular Disease

## 2013-08-23 ENCOUNTER — Encounter: Payer: Self-pay | Admitting: Cardiovascular Disease

## 2013-08-23 VITALS — BP 100/60 | HR 81 | Ht 66.0 in | Wt 187.2 lb

## 2013-08-23 DIAGNOSIS — E119 Type 2 diabetes mellitus without complications: Secondary | ICD-10-CM

## 2013-08-23 DIAGNOSIS — I2510 Atherosclerotic heart disease of native coronary artery without angina pectoris: Secondary | ICD-10-CM

## 2013-08-23 DIAGNOSIS — I10 Essential (primary) hypertension: Secondary | ICD-10-CM

## 2013-08-23 DIAGNOSIS — E782 Mixed hyperlipidemia: Secondary | ICD-10-CM

## 2013-08-23 MED ORDER — LISINOPRIL 5 MG PO TABS: 5.0000 mg | ORAL_TABLET | Freq: Every day | ORAL | Status: DC

## 2013-08-23 NOTE — Assessment & Plan Note (Signed)
Blood pressure borderline low today. No symptoms of dizziness or lightheadedness. We have suggested she closely monitor her blood pressure at home

## 2013-08-23 NOTE — Assessment & Plan Note (Signed)
Managed by Dr. Sullivan Lone. Encouraged strict diet and exercise, hemoglobin A1c less than 6.5

## 2013-08-23 NOTE — Assessment & Plan Note (Signed)
Cholesterol above goal when checked last year. Recommended she try to lose several pounds, work on her sugars. Goal LDL less than 70

## 2013-08-23 NOTE — Assessment & Plan Note (Signed)
Currently with no symptoms of angina. No further workup at this time. Continue current medication regimen. We did discuss her new EKG changes. She does not want workup at this time. We have suggested if she has any symptoms concerning for angina including shortness of breath, chest discomfort, sweating, that she call our office.

## 2013-08-23 NOTE — Progress Notes (Signed)
Patient ID: Kelly Manning, female    DOB: 01-12-1956, 57 y.o.   MRN: [id number]  HPI Comments: Kelly Manning is a 57 y/o woman with h/o DM2, hyperlipidemia, CAD with stent to her LAD in May 2011, who presents for routine followup.     CP in 2011, cardiac cath  which showed EF 55% with 90-95% lesion in LAD. RCA and LCX normal. Underwent Promus DES to LAD and balloon angioplasty of the diagonal.  she reports that she is doing well with no complaints of chest pain or shortness of breath. No official exercise program but she takes care of her 9-year-old niece every afternoon and is very active during his time. He denies any significant chest pain. No shortness of breath. She works third shift. Sugar levels are "okay". Hemoglobin A1c uncertain as we have no recent lab work  Stress test August 2012 showing no perfusion abnormality, low risk scan, normal ejection fraction  EKG shows normal sinus rhythm with rate 80 beats per minute with ST and T wave abnormality in lead 2, 3, aVF which is new compared to prior EKGs  Prior lab work shows total cholesterol greater than 170, LDL in the 90 range       Outpatient Encounter Prescriptions as of 08/23/2013  Medication Sig  . aspirin 81 MG tablet Take 81 mg by mouth daily.    Marland Kitchen atorvastatin (LIPITOR) 40 MG tablet Take 1 tablet (40 mg total) by mouth daily.  Marland Kitchen glipiZIDE (GLUCOTROL XL) 10 MG 24 hr tablet Take 10 mg by mouth daily.  Marland Kitchen lisinopril (PRINIVIL,ZESTRIL) 5 MG tablet Take 5 mg by mouth daily.    . metFORMIN (GLUCOPHAGE) 1000 MG tablet 1,000 mg 2 (two) times daily with a meal.   . [DISCONTINUED] simvastatin (ZOCOR) 20 MG tablet Take 1 tablet (20 mg total) by mouth at bedtime.  . [DISCONTINUED] sitaGLIPtan-metformin (JANUMET) 50-1000 MG per tablet Take 1 tablet by mouth 2 (two) times daily.      Review of Systems  Constitutional: Negative.   HENT: Negative.   Eyes: Negative.   Respiratory: Negative.   Cardiovascular: Negative.    Gastrointestinal: Negative.   Endocrine: Negative.   Musculoskeletal: Negative.   Skin: Negative.   Allergic/Immunologic: Negative.   Neurological: Negative.   Hematological: Negative.   Psychiatric/Behavioral: Negative.   All other systems reviewed and are negative.     BP 100/60  Pulse 81  Ht 5\' 6"  (1.676 m)  Wt 187 lb 4 oz (84.936 kg)  BMI 30.24 kg/m2  Physical Exam  Nursing note and vitals reviewed. Constitutional: She is oriented to person, place, and time. She appears well-developed and well-nourished.  HENT:  Head: Normocephalic.  Nose: Nose normal.  Mouth/Throat: Oropharynx is clear and moist.  Eyes: Conjunctivae are normal. Pupils are equal, round, and reactive to light.  Neck: Normal range of motion. Neck supple. No JVD present.  Cardiovascular: Normal rate, regular rhythm, S1 normal, S2 normal, normal heart sounds and intact distal pulses.  Exam reveals no gallop and no friction rub.   No murmur heard. Pulmonary/Chest: Effort normal and breath sounds normal. No respiratory distress. She has no wheezes. She has no rales. She exhibits no tenderness.  Abdominal: Soft. Bowel sounds are normal. She exhibits no distension. There is no tenderness.  Musculoskeletal: Normal range of motion. She exhibits no edema and no tenderness.  Lymphadenopathy:    She has no cervical adenopathy.  Neurological: She is alert and oriented to person, place, and time. Coordination normal.  Skin: Skin is warm and dry. No rash noted. No erythema.  Psychiatric: She has a normal mood and affect. Her behavior is normal. Judgment and thought content normal.    Assessment and Plan

## 2013-08-23 NOTE — Patient Instructions (Signed)
You are doing well. No medication changes were made.  Please call us if you have new issues that need to be addressed before your next appt.  Your physician wants you to follow-up in: 12 months.  You will receive a reminder letter in the mail two months in advance. If you don't receive a letter, please call our office to schedule the follow-up appointment. 

## 2014-09-20 ENCOUNTER — Other Ambulatory Visit: Payer: Self-pay | Admitting: Cardiovascular Disease

## 2014-09-26 LAB — HEMOGLOBIN A1C: HEMOGLOBIN A1C: 9.8 % — AB (ref 4.0–6.0)

## 2014-11-01 ENCOUNTER — Other Ambulatory Visit: Payer: Self-pay | Admitting: Cardiovascular Disease

## 2014-12-19 ENCOUNTER — Ambulatory Visit (INDEPENDENT_AMBULATORY_CARE_PROVIDER_SITE_OTHER): Payer: Self-pay | Admitting: Cardiovascular Disease

## 2014-12-19 ENCOUNTER — Encounter (INDEPENDENT_AMBULATORY_CARE_PROVIDER_SITE_OTHER): Payer: Self-pay

## 2014-12-19 ENCOUNTER — Encounter: Payer: Self-pay | Admitting: Cardiovascular Disease

## 2014-12-19 VITALS — BP 110/70 | HR 79 | Ht 66.0 in | Wt 196.0 lb

## 2014-12-19 DIAGNOSIS — E118 Type 2 diabetes mellitus with unspecified complications: Secondary | ICD-10-CM

## 2014-12-19 DIAGNOSIS — I25119 Atherosclerotic heart disease of native coronary artery with unspecified angina pectoris: Secondary | ICD-10-CM | POA: Diagnosis not present

## 2014-12-19 DIAGNOSIS — E1165 Type 2 diabetes mellitus with hyperglycemia: Secondary | ICD-10-CM

## 2014-12-19 DIAGNOSIS — R079 Chest pain, unspecified: Secondary | ICD-10-CM

## 2014-12-19 DIAGNOSIS — E782 Mixed hyperlipidemia: Secondary | ICD-10-CM

## 2014-12-19 DIAGNOSIS — IMO0002 Reserved for concepts with insufficient information to code with codable children: Secondary | ICD-10-CM

## 2014-12-19 DIAGNOSIS — I10 Essential (primary) hypertension: Secondary | ICD-10-CM

## 2014-12-19 NOTE — Progress Notes (Signed)
Patient ID: Kelly Manning, female    DOB: 1956-07-29, 59 y.o.   MRN: [id number]  HPI Comments: Kelly Manning is a 59 y/o woman with h/o DM2, hyperlipidemia, CAD with stent to her LAD in May 2011, who presents for routine followup of her coronary artery disease and diabetes.   In follow-up today, she reports that she feels well. Her sugars have been running high. She has not had any shortness of breath or chest pain with exertion No regular exercise program  EKG on today's visit shows normal sinus rhythm with rate 79 bpm, no significant ST or T-wave changes   Other past medical history  CP in 2011, cardiac cath  which showed EF 55% with 90-95% lesion in LAD. RCA and LCX normal. Underwent Promus DES to LAD and balloon angioplasty of the diagonal. Stress test August 2012 showing no perfusion abnormality, low risk scan, normal ejection fraction  Prior lab work shows total cholesterol greater than 170, LDL in the 90 range       Allergies  Allergen Reactions  . Clopidogrel Bisulfate     REACTION: rash    Outpatient Encounter Prescriptions as of 12/19/2014  Medication Sig  . aspirin 81 MG tablet Take 81 mg by mouth daily.    Marland Kitchen atorvastatin (LIPITOR) 40 MG tablet Take 1 tablet (40 mg total) by mouth daily.  Marland Kitchen glipiZIDE (GLUCOTROL XL) 10 MG 24 hr tablet Take 10 mg by mouth daily.  Marland Kitchen JANUMET 50-1000 MG per tablet Take 1 tablet by mouth.   Marland Kitchen lisinopril (PRINIVIL,ZESTRIL) 5 MG tablet TAKE 1 TABLET DAILY .Marland KitchenMarland KitchenNEED OFFICE VIST FOR ADDITIONAL REFILLS!!  . pioglitazone (ACTOS) 15 MG tablet Take 15 mg by mouth daily.  . [DISCONTINUED] metFORMIN (GLUCOPHAGE) 1000 MG tablet 1,000 mg 2 (two) times daily with a meal.     Past Medical History  Diagnosis Date  . DM2 (diabetes mellitus, type 2)   . Hyperlipidemia   . CAD (coronary artery disease)     -s/p DES to LAD in May 2011  . Hypertension     Past Surgical History  Procedure Laterality Date  . Partial hysterectomy    . Dilation and  curettage of uterus    . Cardiac catheterization      Social History  reports that she has never smoked. She does not have any smokeless tobacco history on file. She reports that she does not drink alcohol or use illicit drugs.  Family History family history includes Coronary artery disease in her mother; Diabetes in her mother; Ovarian cancer in her mother.     Review of Systems  Constitutional: Negative.   Respiratory: Negative.   Cardiovascular: Negative.   Gastrointestinal: Negative.   Musculoskeletal: Negative.   Skin: Negative.   Neurological: Negative.   Hematological: Negative.   Psychiatric/Behavioral: Negative.   All other systems reviewed and are negative.    BP 110/70 mmHg  Pulse 79  Ht  (1.676 m)  Physical Exam  Constitutional: She is oriented to person, place, and time. She appears well-developed and well-nourished.  HENT:  Head: Normocephalic.  Nose: Nose normal.  Mouth/Throat: Oropharynx is clear and moist.  Eyes: Conjunctivae are normal. Pupils are equal, round, and reactive to light.  Neck: Normal range of motion. Neck supple. No JVD present.  Cardiovascular: Normal rate, regular rhythm, S1 normal, S2 normal, normal heart sounds and intact distal pulses.  Exam reveals no gallop and no friction rub.   No murmur heard. Pulmonary/Chest: Effort normal and breath  sounds normal. No respiratory distress. She has no wheezes. She has no rales. She exhibits no tenderness.  Abdominal: Soft. Bowel sounds are normal. She exhibits no distension. There is no tenderness.  Musculoskeletal: Normal range of motion. She exhibits no edema or tenderness.  Lymphadenopathy:    She has no cervical adenopathy.  Neurological: She is alert and oriented to person, place, and time. Coordination normal.  Skin: Skin is warm and dry. No rash noted. No erythema.  Psychiatric: She has a normal mood and affect. Her behavior is normal. Judgment and thought content normal.     Assessment and Plan  Nursing note and vitals reviewed.

## 2014-12-19 NOTE — Assessment & Plan Note (Signed)
Blood pressure is well controlled on today's visit. No changes made to the medications. 

## 2014-12-19 NOTE — Assessment & Plan Note (Signed)
Currently with no symptoms of chest pain. No further workup at this time 

## 2014-12-19 NOTE — Assessment & Plan Note (Signed)
Managed by Dr. Sullivan LoneGilbert. Encouraged strict diet and exercise,  She reports sugars have been running high recently

## 2014-12-19 NOTE — Assessment & Plan Note (Signed)
Currently with no symptoms of angina. No further workup at this time. Continue current medication regimen. 

## 2014-12-19 NOTE — Patient Instructions (Signed)
You are doing well. No medication changes were made.  Try to keep the sugars lower  Please call us if you have new issues that need to be addressed before your next appt.  Your physician wants you to follow-up in: 12 months.  You will receive a reminder letter in the mail two months in advance. If you don't receive a letter, please call our office to schedule the follow-up appointment.

## 2014-12-19 NOTE — Assessment & Plan Note (Signed)
Encouraged her to stay on her Lipitor. Goal LDL less than 70 

## 2015-01-23 LAB — BASIC METABOLIC PANEL
BUN: 27 mg/dL — AB (ref 4–21)
Creatinine: 1 mg/dL (ref 0.5–1.1)
Glucose: 183 mg/dL
POTASSIUM: 5.2 mmol/L (ref 3.4–5.3)
SODIUM: 137 mmol/L (ref 137–147)

## 2015-01-23 LAB — HEPATIC FUNCTION PANEL
ALT: 18 U/L (ref 7–35)
AST: 18 U/L (ref 13–35)
Alkaline Phosphatase: 110 U/L (ref 25–125)

## 2015-01-23 LAB — CBC AND DIFFERENTIAL
HEMATOCRIT: 38 % (ref 36–46)
HEMOGLOBIN: 12.5 g/dL (ref 12.0–16.0)
Neutrophils Absolute: 4 /uL
Platelets: 353 10*3/uL (ref 150–399)
WBC: 7.1 10^3/mL

## 2015-01-23 LAB — LIPID PANEL
Cholesterol: 1 mg/dL (ref 0–200)
HDL: 66 mg/dL (ref 35–70)
LDL Cholesterol: 62 mg/dL
LDl/HDL Ratio: 0.9
Triglycerides: 100 mg/dL (ref 40–160)

## 2015-02-24 ENCOUNTER — Encounter: Payer: Self-pay | Admitting: Emergency Medicine

## 2015-02-24 ENCOUNTER — Other Ambulatory Visit: Payer: Self-pay | Admitting: Emergency Medicine

## 2015-02-24 DIAGNOSIS — E7800 Pure hypercholesterolemia, unspecified: Secondary | ICD-10-CM | POA: Insufficient documentation

## 2015-02-24 DIAGNOSIS — E669 Obesity, unspecified: Secondary | ICD-10-CM | POA: Insufficient documentation

## 2015-02-24 DIAGNOSIS — R809 Proteinuria, unspecified: Secondary | ICD-10-CM | POA: Insufficient documentation

## 2015-03-01 ENCOUNTER — Ambulatory Visit (INDEPENDENT_AMBULATORY_CARE_PROVIDER_SITE_OTHER): Payer: BLUE CROSS/BLUE SHIELD | Admitting: Family Medicine

## 2015-03-01 ENCOUNTER — Encounter: Payer: Self-pay | Admitting: Family Medicine

## 2015-03-01 VITALS — BP 110/62 | HR 62 | Temp 97.8°F | Resp 16 | Wt 193.0 lb

## 2015-03-01 DIAGNOSIS — E1165 Type 2 diabetes mellitus with hyperglycemia: Secondary | ICD-10-CM

## 2015-03-01 DIAGNOSIS — IMO0002 Reserved for concepts with insufficient information to code with codable children: Secondary | ICD-10-CM

## 2015-03-01 DIAGNOSIS — E118 Type 2 diabetes mellitus with unspecified complications: Secondary | ICD-10-CM | POA: Diagnosis not present

## 2015-03-01 MED ORDER — CANAGLIFLOZIN 100 MG PO TABS: 100.0000 mg | ORAL_TABLET | Freq: Every day | ORAL | Status: DC

## 2015-03-01 NOTE — Progress Notes (Signed)
Kelly Manning  MRN: [id number] DOB: 12-May-1956  Subjective:  Diabetes She presents for her follow-up (At LOV we started her on Invokana because her A1c had gone up to 10.2) diabetic visit. She has type 2 diabetes mellitus. Hypoglycemia symptoms include sweats and tremors (when blood sugar is low). (Pt reports that she has had 4 hypoglycemia incidence since starting medication. But she thinks it is her body getting used to the medication.) There are no diabetic associated symptoms. There are no hypoglycemic complications. She participates in exercise daily. Her home blood glucose trend is decreasing steadily. Her breakfast blood glucose range is generally 90-110 mg/dl. (Pt reports that her highest blood sugar has been 159 over the last month. She has also changed her diet and started exercising. )    Patient Active Problem List   Diagnosis Date Noted  . Calcium blood increased 02/24/2015  . Hypercholesteremia 02/24/2015  . Microalbuminuria 02/24/2015  . Adiposity 02/24/2015  . HYPERLIPIDEMIA TYPE IIB / III 02/15/2010  . HYPERTENSION, BENIGN 02/15/2010  . CAD, NATIVE VESSEL 02/15/2010  . CAD in native artery 01/24/2010  . Diabetes mellitus type 2 with complications, uncontrolled 12/26/2009  . Chest pain 12/26/2009  . Family history of cardiovascular disease 12/26/2009  . Diabetes 05/15/2009    Past Medical History  Diagnosis Date  . DM2 (diabetes mellitus, type 2)   . Hyperlipidemia   . CAD (coronary artery disease)     -s/p DES to LAD in May 2011  . Hypertension     History   Social History  . Marital Status: Single    Spouse Name: N/A  . Number of Children: N/A  . Years of Education: N/A   Occupational History  . Not on file.   Social History Main Topics  . Smoking status: Never Smoker   . Smokeless tobacco: Not on file     Comment: tobacco use- no   . Alcohol Use: No  . Drug Use: No  . Sexual Activity: Not on file   Other Topics Concern  . Not on file    Social History Narrative   Regularly exercise. Single. Full time.     Outpatient Prescriptions Prior to Visit  Medication Sig Dispense Refill  . aspirin 81 MG tablet Take by mouth.    Marland Kitchen atorvastatin (LIPITOR) 40 MG tablet Take by mouth.    . canagliflozin (INVOKANA) 100 MG TABS tablet Take by mouth.    Marland Kitchen glipiZIDE (GLUCOTROL) 10 MG tablet Take by mouth.    Marland Kitchen lisinopril (PRINIVIL,ZESTRIL) 10 MG tablet Take by mouth.    . sitaGLIPtin-metformin (JANUMET) 50-1000 MG per tablet Take by mouth.    . pioglitazone (ACTOS) 15 MG tablet Take 15 mg by mouth daily.    Marland Kitchen aspirin 81 MG tablet Take 81 mg by mouth daily.       No facility-administered medications prior to visit.    Allergies  Allergen Reactions  . Clopidogrel Bisulfate     REACTION: rash    Review of Systems  Constitutional: Negative.   HENT: Negative.   Eyes: Negative.   Respiratory: Negative.   Cardiovascular: Negative.   Gastrointestinal: Negative.   Genitourinary: Negative.   Musculoskeletal: Negative.   Skin: Negative.   Neurological: Positive for tremors (when blood sugar is low).  Endo/Heme/Allergies: Negative.   Psychiatric/Behavioral: Negative.    Objective:  BP 110/62 mmHg  Pulse 62  Temp(Src) 97.8 F (36.6 C) (Oral)  Resp 16  Wt 193 lb (87.544 kg)  Physical Exam  Constitutional: She is oriented to person, place, and time and well-developed, well-nourished, and in no distress.  HENT:  Head: Normocephalic.  Right Ear: External ear normal.  Left Ear: External ear normal.  Nose: Nose normal.  Mouth/Throat: Oropharynx is clear and moist.  Neck: Normal range of motion. Neck supple.  Cardiovascular: Normal rate, regular rhythm, normal heart sounds and intact distal pulses.   Pulmonary/Chest: Effort normal and breath sounds normal.  Musculoskeletal: Normal range of motion.  Neurological: She is alert and oriented to person, place, and time. She has normal reflexes. Gait normal. GCS score is 15.  Skin:  Skin is warm and dry.  Psychiatric: Mood, memory, affect and judgment normal.  Vitals reviewed.   Assessment and Plan :  Diabetes mellitus type 2 with complications, uncontrolled Patient wishes to work on diet and exercise. Turn to clinic 3 months. Hypertension. Controlled  Hyperlipidemia Treated Julieanne Mansonichard Jerrard Bradburn MD [facility] 03/01/2015 8:46 AM

## 2015-03-16 ENCOUNTER — Other Ambulatory Visit: Payer: Self-pay | Admitting: Family Medicine

## 2015-05-03 ENCOUNTER — Encounter: Payer: Self-pay | Admitting: Family Medicine

## 2015-05-03 ENCOUNTER — Ambulatory Visit (INDEPENDENT_AMBULATORY_CARE_PROVIDER_SITE_OTHER): Payer: BLUE CROSS/BLUE SHIELD | Admitting: Family Medicine

## 2015-05-03 VITALS — BP 118/76 | HR 72 | Temp 98.2°F | Resp 14 | Wt 181.0 lb

## 2015-05-03 DIAGNOSIS — E118 Type 2 diabetes mellitus with unspecified complications: Secondary | ICD-10-CM | POA: Diagnosis not present

## 2015-05-03 DIAGNOSIS — IMO0002 Reserved for concepts with insufficient information to code with codable children: Secondary | ICD-10-CM

## 2015-05-03 DIAGNOSIS — E7800 Pure hypercholesterolemia, unspecified: Secondary | ICD-10-CM

## 2015-05-03 DIAGNOSIS — I2510 Atherosclerotic heart disease of native coronary artery without angina pectoris: Secondary | ICD-10-CM | POA: Diagnosis not present

## 2015-05-03 DIAGNOSIS — E1165 Type 2 diabetes mellitus with hyperglycemia: Secondary | ICD-10-CM

## 2015-05-03 DIAGNOSIS — I10 Essential (primary) hypertension: Secondary | ICD-10-CM

## 2015-05-03 LAB — POCT UA - MICROALBUMIN: MICROALBUMIN (UR) POC: 50 mg/L

## 2015-05-03 LAB — POCT GLYCOSYLATED HEMOGLOBIN (HGB A1C): HEMOGLOBIN A1C: 7.1

## 2015-05-03 NOTE — Progress Notes (Signed)
Patient ID: Kelly Manning, female   DOB: 12/24/1955, 59 y.o.   MRN: [id number]    Subjective:  HPI   Diabetes Mellitus Type II, Follow-up:   Lab Results  Component Value Date   HGBA1C 9.8* 09/26/2014    Last seen for diabetes 3 months ago.  Management changes included none. She reports good compliance with treatment. She is not having side effects.  Current symptoms include hypoglycemia  and have been unchanged. Home blood sugar records: in 40s to 160-170  Episodes of hypoglycemia? yes - a few times   Current Insulin Regimen: none Most Recent Eye Exam: over a year Weight trend: stable Prior visit with dietician: yes - over a year per patient she has one at work also that she can see. Current diet: watches her carbs and calories. Current exercise: none  Pertinent Labs:    Component Value Date/Time   CHOL 1 01/23/2015   TRIG 100 01/23/2015   CREATININE 1.0 01/23/2015   CREATININE 0.74 01/24/2010 0330    Wt Readings from Last 3 Encounters:  05/03/15 181 lb (82.101 kg)  03/01/15 193 lb (87.544 kg)  09/26/14 196 lb (88.905 kg)      Prior to Admission medications   Medication Sig Start Date End Date Taking? Authorizing Provider  aspirin 81 MG tablet Take by mouth.    Historical Provider, MD  atorvastatin (LIPITOR) 40 MG tablet TAKE ONE TABLET BY MOUTH EVERY DAY 03/17/15   Jerrol Banana., MD  canagliflozin [facility]) 100 MG TABS tablet Take 1 tablet (100 mg total) by mouth daily. 03/01/15   Richard Maceo Pro., MD  glipiZIDE (GLUCOTROL) 10 MG tablet Take by mouth. 09/26/14   Historical Provider, MD  lisinopril (PRINIVIL,ZESTRIL) 10 MG tablet Take by mouth. 09/21/14   Historical Provider, MD  sitaGLIPtin-metformin (JANUMET) 50-1000 MG per tablet Take by mouth. 12/28/14   Historical Provider, MD    Patient Active Problem List   Diagnosis Date Noted  . Calcium blood increased 02/24/2015  . Hypercholesteremia 02/24/2015  . Microalbuminuria 02/24/2015  .  Adiposity 02/24/2015  . HYPERLIPIDEMIA TYPE IIB / III 02/15/2010  . HYPERTENSION, BENIGN 02/15/2010  . CAD, NATIVE VESSEL 02/15/2010  . CAD in native artery 01/24/2010  . Diabetes mellitus type 2 with complications, uncontrolled 12/26/2009  . Chest pain 12/26/2009  . Family history of cardiovascular disease 12/26/2009  . Diabetes 05/15/2009    Past Medical History  Diagnosis Date  . DM2 (diabetes mellitus, type 2)   . Hyperlipidemia   . CAD (coronary artery disease)     -s/p DES to LAD in May 2011  . Hypertension     Social History   Social History  . Marital Status: Single    Spouse Name: N/A  . Number of Children: N/A  . Years of Education: N/A   Occupational History  . Not on file.   Social History Main Topics  . Smoking status: Never Smoker   . Smokeless tobacco: Never Used     Comment: tobacco use- no   . Alcohol Use: No  . Drug Use: No  . Sexual Activity: Not on file   Other Topics Concern  . Not on file   Social History Narrative   Regularly exercise. Single. Full time.     Allergies  Allergen Reactions  . Clopidogrel Bisulfate     REACTION: rash    Review of Systems  Constitutional: Negative for fever, chills and malaise/fatigue.  Eyes: Negative.   Respiratory: Negative for cough,  hemoptysis, sputum production and shortness of breath.   Cardiovascular: Negative for chest pain, palpitations, orthopnea, claudication and leg swelling.  Gastrointestinal: Negative for heartburn, nausea, vomiting and abdominal pain.  Musculoskeletal: Negative for myalgias, back pain, joint pain, falls and neck pain.  Neurological: Negative for dizziness, tingling, tremors, weakness and headaches.  Psychiatric/Behavioral: The patient is not nervous/anxious and does not have insomnia.     Immunization History  Administered Date(s) Administered  . Pneumococcal Polysaccharide-23 03/18/2011  . Tdap 10/29/2012   Objective:  BP 118/76 mmHg  Pulse 72  Temp(Src) 98.2 F  (36.8 C)  Resp 14  Wt 181 lb (82.101 kg)  Physical Exam  Constitutional: She is oriented to person, place, and time and well-developed, well-nourished, and in no distress.  HENT:  Head: Normocephalic.  Right Ear: External ear normal.  Left Ear: External ear normal.  Nose: Nose normal.  Eyes: Conjunctivae are normal.  Neck: Neck supple.  Cardiovascular: Normal rate, regular rhythm and normal heart sounds.   Pulmonary/Chest: Effort normal and breath sounds normal.  Abdominal: Soft. Bowel sounds are normal.  Neurological: She is alert and oriented to person, place, and time. Gait normal.  Skin: Skin is warm and dry.  Psychiatric: Mood, memory, affect and judgment normal.    Lab Results  Component Value Date   WBC 7.1 01/23/2015   HGB 12.5 01/23/2015   HCT 38 01/23/2015   PLT 353 01/23/2015   GLUCOSE 166* 01/24/2010   CHOL 1 01/23/2015   TRIG 100 01/23/2015   HDL 66 01/23/2015   LDLCALC 62 01/23/2015   INR 0.95 01/15/2010   HGBA1C 9.8* 09/26/2014    CMP     Component Value Date/Time   NA 137 01/23/2015   NA 136 01/24/2010 0330   K 5.2 01/23/2015   CL 105 01/24/2010 0330   CO2 24 01/24/2010 0330   GLUCOSE 166* 01/24/2010 0330   BUN 27* 01/23/2015   BUN 7 01/24/2010 0330   CREATININE 1.0 01/23/2015   CREATININE 0.74 01/24/2010 0330   CALCIUM 9.1 01/24/2010 0330   AST 18 01/23/2015   ALT 18 01/23/2015   ALKPHOS 110 01/23/2015   GFRNONAA >60 01/24/2010 0330   GFRAA  01/24/2010 0330    >60        The eGFR has been calculated using the MDRD equation. This calculation has not been validated in all clinical situations. eGFR's persistently <60 mL/min signify possible Chronic Kidney Disease.    Assessment and Plan :  1. Diabetes mellitus type 2 with complications, uncontrolled A1C 7.1 today. Due to frequent hypoglycemic episodes advised patient to take Glipizide 1/2 tablet daily and re assess on the next visit.  2. CAD Risk  3. HYPERTENSION, BENIGN  4.  Havana MD [facility] 05/03/2015 4:19 PM

## 2015-05-17 ENCOUNTER — Other Ambulatory Visit: Payer: Self-pay | Admitting: Family Medicine

## 2015-05-17 MED ORDER — GLIPIZIDE 5 MG PO TABS: 5.0000 mg | ORAL_TABLET | Freq: Every day | ORAL | Status: DC

## 2015-05-17 NOTE — Telephone Encounter (Signed)
Ok to refill? Please advise. Thanks!  

## 2015-05-17 NOTE — Telephone Encounter (Signed)
Pt contacted office for refill request on the following medications:  glipiZIDE (GLUCOTROL) 10 MG.  Pt is requesting this changed to .  [facility].  ZO#[phone]

## 2015-05-18 NOTE — Telephone Encounter (Signed)
This was done-aa 

## 2015-05-18 NOTE — Telephone Encounter (Signed)
Ok to do this 

## 2015-06-19 ENCOUNTER — Encounter: Payer: Self-pay | Admitting: Family Medicine

## 2015-06-19 ENCOUNTER — Ambulatory Visit (INDEPENDENT_AMBULATORY_CARE_PROVIDER_SITE_OTHER): Payer: BLUE CROSS/BLUE SHIELD | Admitting: Family Medicine

## 2015-06-19 ENCOUNTER — Other Ambulatory Visit: Payer: Self-pay | Admitting: *Deleted

## 2015-06-19 VITALS — BP 112/64 | HR 64 | Temp 98.2°F | Resp 14 | Ht 67.0 in | Wt 183.0 lb

## 2015-06-19 DIAGNOSIS — Z0000 Encounter for general adult medical examination without abnormal findings: Secondary | ICD-10-CM

## 2015-06-19 LAB — POCT URINALYSIS DIPSTICK
BILIRUBIN UA: NEGATIVE
Blood, UA: NEGATIVE
GLUCOSE UA: 2000
Ketones, UA: NEGATIVE
LEUKOCYTES UA: NEGATIVE
Nitrite, UA: NEGATIVE
PROTEIN UA: NEGATIVE
SPEC GRAV UA: 1.02
Urobilinogen, UA: 0.2
pH, UA: 6

## 2015-06-19 MED ORDER — LISINOPRIL 5 MG PO TABS: 5.0000 mg | ORAL_TABLET | Freq: Every day | ORAL | Status: DC

## 2015-06-19 NOTE — Progress Notes (Signed)
Patient ID: Kelly Manning, female   DOB: 03-23-1956, 59 y.o.   MRN: [id number] Patient: Kelly Manning, Female    DOB: 12/16/1955, 59 y.o.   MRN: [id number] Visit Date: 06/19/2015  Today's Provider: Megan Mans, MD   Chief Complaint  Patient presents with  . Annual Exam   Subjective:  Kelly Manning is a 59 y.o. female who presents today for health maintenance and complete physical. She feels well. She reports exercising none. Patient states she is currently working 16 hour shifts. She reports she is sleeping well (average 6 hours a night).  EKG: 03/18/2011 Mammo: 03/20/2011 Pap Smear: 03/18/2011 Normal Colonoscopy: Pneumovax 23: 03/18/2011 Tdap: 10/29/2012   Review of Systems  Constitutional: Negative.   HENT: Negative.   Eyes: Negative.   Respiratory: Negative.   Cardiovascular: Negative.   Gastrointestinal: Negative.   Endocrine: Negative.   Genitourinary: Negative.   Musculoskeletal: Negative.   Skin: Negative.   Allergic/Immunologic: Negative.   Neurological: Negative.   Hematological: Negative.   Psychiatric/Behavioral: Negative.   Pt tired recently  As she has been working about 75-80 hours per week.  Social History   Social History  . Marital Status: Single    Spouse Name: N/A  . Number of Children: N/A  . Years of Education: N/A   Occupational History  . Not on file.   Social History Main Topics  . Smoking status: Never Smoker   . Smokeless tobacco: Never Used     Comment: tobacco use- no   . Alcohol Use: No  . Drug Use: No  . Sexual Activity: Not on file   Other Topics Concern  . Not on file   Social History Narrative   Regularly exercise. Single. Full time.     Patient Active Problem List   Diagnosis Date Noted  . Calcium blood increased 02/24/2015  . Hypercholesteremia 02/24/2015  . Microalbuminuria 02/24/2015  . Adiposity 02/24/2015  . HYPERLIPIDEMIA TYPE IIB / III 02/15/2010  . HYPERTENSION, BENIGN 02/15/2010  . CAD, NATIVE  VESSEL 02/15/2010  . CAD in native artery 01/24/2010  . Diabetes mellitus type 2 with complications, uncontrolled 12/26/2009  . Chest pain 12/26/2009  . Family history of cardiovascular disease 12/26/2009  . Diabetes 05/15/2009    Past Surgical History  Procedure Laterality Date  . Partial hysterectomy    . Dilation and curettage of uterus    . Cardiac catheterization      Her family history includes Cancer in her brother; Coronary artery disease in her mother; Diabetes in her brother, brother, brother, brother, brother, brother, mother, sister, sister, sister, sister, and sister; Heart failure in her brother and sister; Ovarian cancer in her mother.    Outpatient Prescriptions Prior to Visit  Medication Sig Dispense Refill  . aspirin 81 MG tablet Take by mouth.    Marland Kitchen atorvastatin (LIPITOR) 40 MG tablet TAKE ONE TABLET BY MOUTH EVERY DAY 30 tablet 12  . canagliflozin (INVOKANA) 100 MG TABS tablet Take 1 tablet (100 mg total) by mouth daily. 30 tablet 12  . glipiZIDE (GLUCOTROL) 5 MG tablet Take 1 tablet (5 mg total) by mouth daily. 30 tablet 12  . lisinopril (PRINIVIL,ZESTRIL) 5 MG tablet Take 1 tablet (5 mg total) by mouth daily. 30 tablet 3  . sitaGLIPtin-metformin (JANUMET) 50-1000 MG per tablet Take by mouth.     No facility-administered medications prior to visit.    Patient Care Team: Maple Hudson., MD as PCP - General (Family Medicine)  Objective:   Vitals:  Filed Vitals:   06/19/15 1350  BP: 112/64  Pulse: 64  Temp: 98.2 F (36.8 C)  TempSrc: Oral  Resp: 14  Height:  (1.702 m)  Weight: 183 lb (83.008 kg)    Physical Exam  Constitutional: She is oriented to person, place, and time. She appears well-developed and well-nourished.  HENT:  Head: Normocephalic and atraumatic.  Right Ear: External ear normal.  Left Ear: External ear normal.  Nose: Nose normal.  Mouth/Throat: Oropharynx is clear and moist.  Eyes: Conjunctivae and EOM are normal.  Pupils are equal, round, and reactive to light.  Neck: Neck supple.  Cardiovascular: Normal rate, regular rhythm, normal heart sounds and intact distal pulses.   Pulmonary/Chest: Effort normal and breath sounds normal.  Genitourinary: Vagina normal. Guaiac negative stool. No vaginal discharge found.  Musculoskeletal: Normal range of motion. She exhibits no edema or tenderness.  Neurological: She is alert and oriented to person, place, and time.  Skin: Skin is warm and dry.  Psychiatric: She has a normal mood and affect. Her behavior is normal. Judgment and thought content normal.     Depression Screen No flowsheet data found.    Assessment & Plan:     Routine Health Maintenance and Physical Exam  Exercise Activities and Dietary recommendations Goals    None      Immunization History  Administered Date(s) Administered  . Pneumococcal Polysaccharide-23 03/18/2011  . Tdap 10/29/2012    Health Maintenance  Topic Date Due  . Hepatitis C Screening  June 16, 1956  . FOOT EXAM  08/26/1967  . OPHTHALMOLOGY EXAM  08/26/1967  . HIV Screening  08/25/1972  . PAP SMEAR  08/25/1978  . MAMMOGRAM  03/19/2013  . INFLUENZA VACCINE  04/24/2015  . HEMOGLOBIN A1C  11/03/2015  . PNEUMOCOCCAL POLYSACCHARIDE VACCINE (2) 03/17/2016  . URINE MICROALBUMIN  05/02/2016  . COLONOSCOPY  05/05/2021  . TETANUS/TDAP  10/29/2022      Discussed health benefits of physical activity, and encouraged her to engage in regular exercise appropriate for her age and condition.    ------------------------------------------------------------------------------------------------------------

## 2015-06-20 LAB — CBC
HEMOGLOBIN: 13.6 g/dL (ref 11.1–15.9)
Hematocrit: 40.3 % (ref 34.0–46.6)
MCH: 29.5 pg (ref 26.6–33.0)
MCHC: 33.7 g/dL (ref 31.5–35.7)
MCV: 87 fL (ref 79–97)
Platelets: 354 10*3/uL (ref 150–379)
RBC: 4.61 x10E6/uL (ref 3.77–5.28)
RDW: 13.4 % (ref 12.3–15.4)
WBC: 6.9 10*3/uL (ref 3.4–10.8)

## 2015-06-20 LAB — COMPREHENSIVE METABOLIC PANEL
A/G RATIO: 1.4 (ref 1.1–2.5)
ALBUMIN: 4.8 g/dL (ref 3.5–5.5)
ALK PHOS: 110 IU/L (ref 39–117)
ALT: 19 IU/L (ref 0–32)
AST: 17 IU/L (ref 0–40)
BILIRUBIN TOTAL: 0.2 mg/dL (ref 0.0–1.2)
BUN / CREAT RATIO: 19 (ref 9–23)
BUN: 20 mg/dL (ref 6–24)
CHLORIDE: 99 mmol/L (ref 97–108)
CO2: 24 mmol/L (ref 18–29)
Calcium: 9.9 mg/dL (ref 8.7–10.2)
Creatinine, Ser: 1.04 mg/dL — ABNORMAL HIGH (ref 0.57–1.00)
GFR calc non Af Amer: 60 mL/min/{1.73_m2} (ref >59)
GFR, EST AFRICAN AMERICAN: 69 mL/min/{1.73_m2} (ref >59)
GLUCOSE: 136 mg/dL — AB (ref 65–99)
Globulin, Total: 3.4 g/dL (ref 1.5–4.5)
POTASSIUM: 4.9 mmol/L (ref 3.5–5.2)
SODIUM: 140 mmol/L (ref 134–144)
Total Protein: 8.2 g/dL (ref 6.0–8.5)

## 2015-06-20 LAB — TSH: TSH: 0.396 u[IU]/mL — AB (ref 0.450–4.500)

## 2015-06-22 LAB — PAP IG AND HPV HIGH-RISK
HPV, high-risk: NEGATIVE
PAP SMEAR COMMENT: 0

## 2015-08-28 ENCOUNTER — Ambulatory Visit: Payer: BLUE CROSS/BLUE SHIELD | Admitting: Family Medicine

## 2015-11-06 ENCOUNTER — Ambulatory Visit (INDEPENDENT_AMBULATORY_CARE_PROVIDER_SITE_OTHER): Payer: BLUE CROSS/BLUE SHIELD | Admitting: Family Medicine

## 2015-11-06 ENCOUNTER — Encounter: Payer: Self-pay | Admitting: Family Medicine

## 2015-11-06 VITALS — BP 128/70 | HR 82 | Temp 98.6°F | Resp 18 | Wt 191.0 lb

## 2015-11-06 DIAGNOSIS — E7800 Pure hypercholesterolemia, unspecified: Secondary | ICD-10-CM

## 2015-11-06 DIAGNOSIS — E079 Disorder of thyroid, unspecified: Secondary | ICD-10-CM | POA: Diagnosis not present

## 2015-11-06 DIAGNOSIS — E118 Type 2 diabetes mellitus with unspecified complications: Secondary | ICD-10-CM | POA: Diagnosis not present

## 2015-11-06 DIAGNOSIS — IMO0002 Reserved for concepts with insufficient information to code with codable children: Secondary | ICD-10-CM

## 2015-11-06 DIAGNOSIS — E1165 Type 2 diabetes mellitus with hyperglycemia: Secondary | ICD-10-CM | POA: Diagnosis not present

## 2015-11-06 DIAGNOSIS — I10 Essential (primary) hypertension: Secondary | ICD-10-CM

## 2015-11-06 LAB — POCT GLYCOSYLATED HEMOGLOBIN (HGB A1C): HEMOGLOBIN A1C: 8.3

## 2015-11-06 MED ORDER — ONETOUCH ULTRA SYSTEM W/DEVICE KIT: 1.0000 | PACK | Freq: Two times a day (BID) | Status: DC

## 2015-11-06 MED ORDER — LISINOPRIL 5 MG PO TABS
5.0000 mg | ORAL_TABLET | Freq: Every day | ORAL | Status: DC
Start: 2015-11-06 — End: 2016-12-06

## 2015-11-06 NOTE — Progress Notes (Signed)
Patient ID: Kelly Manning, female   DOB: 09/14/1956, 60 y.o.   MRN: [id number]    Subjective:  HPI  Diabetes Mellitus Type II, Follow-up:   Lab Results  Component Value Date   HGBA1C 7.1 05/03/2015   HGBA1C 9.8* 09/26/2014   Last seen for diabetes 3 months ago.  Management since then includes none. She reports fair compliance with treatment. She is not having side effects.  Current symptoms include none. Home blood sugar records: number have been up and down. Pt had 2 very unexpected deaths in the family and her blood sugars have not been checked as often. In [place] her niece and sister in law were murdered late in 2016 in a domestic violence situation.  Episodes of hypoglycemia? yes - one in the middle of the night was 68. Pt thinks this was due to not eating because if the emotional stress from the 2 deaths in her family.   Current Insulin Regimen: n/a Most Recent Eye Exam: over 2 years Current exercise: none  ------------------------------------------------------------------------   Hypertension, follow-up:  BP Readings from Last 3 Encounters:  11/06/15 128/70  06/19/15 112/64  05/03/15 118/76    She was last seen for hypertension 3 months ago.  BP at that visit was 112/64. Management since that visit includes none .She reports good compliance with treatment. She is not having side effects. She is not exercising. She is adherent to low salt diet.   Outside blood pressures are not being checked. She is experiencing none.  Patient denies chest pain, chest pressure/discomfort, claudication, dyspnea, exertional chest pressure/discomfort, fatigue, irregular heart beat and palpitations.    ------------------------------------------------------------------------    Lipid/Cholesterol, Follow-up:   Last seen for this 3 months ago.  Management since that visit includes none.  Last Lipid Panel:    Component Value Date/Time   CHOL 1 01/23/2015   TRIG 100  01/23/2015   HDL 66 01/23/2015   LDLCALC 62 01/23/2015    She reports good compliance with treatment. She is not having side effects.   Wt Readings from Last 3 Encounters:  11/06/15 191 lb (86.637 kg)  06/19/15 183 lb (83.008 kg)  05/03/15 181 lb (82.101 kg)    ------------------------------------------------------------------------   .  Prior to Admission medications   Medication Sig Start Date End Date Taking? Authorizing Provider  aspirin 81 MG tablet Take by mouth.   Yes Historical Provider, MD  atorvastatin (LIPITOR) 40 MG tablet TAKE ONE TABLET BY MOUTH EVERY DAY 03/17/15  Yes Afifa Truax Hulen Shouts., MD  canagliflozin [facility]) 100 MG TABS tablet Take 1 tablet (100 mg total) by mouth daily. 03/01/15  Yes Chantee Cerino Hulen Shouts., MD  glipiZIDE (GLUCOTROL) 5 MG tablet Take 1 tablet (5 mg total) by mouth daily. 05/17/15  Yes Eutimio Gharibian Hulen Shouts., MD  lisinopril (PRINIVIL,ZESTRIL) 5 MG tablet Take 1 tablet (5 mg total) by mouth daily. 06/19/15  Yes Antonieta Iba, MD  sitaGLIPtin-metformin (JANUMET) 50-1000 MG per tablet Take by mouth. 12/28/14  Yes Historical Provider, MD    Patient Active Problem List   Diagnosis Date Noted  . Calcium blood increased 02/24/2015  . Hypercholesteremia 02/24/2015  . Microalbuminuria 02/24/2015  . Adiposity 02/24/2015  . HYPERLIPIDEMIA TYPE IIB / III 02/15/2010  . HYPERTENSION, BENIGN 02/15/2010  . CAD, NATIVE VESSEL 02/15/2010  . CAD in native artery 01/24/2010  . Diabetes mellitus type 2 with complications, uncontrolled (HCC) 12/26/2009  . Chest pain 12/26/2009  . Family history of cardiovascular disease 12/26/2009  . Diabetes (  HCC) 05/15/2009    Past Medical History  Diagnosis Date  . DM2 (diabetes mellitus, type 2) (HCC)   . Hyperlipidemia   . CAD (coronary artery disease)     -s/p DES to LAD in May 2011  . Hypertension     Social History   Social History  . Marital Status: Single    Spouse Name: N/A  . Number of Children: N/A   . Years of Education: N/A   Occupational History  . Not on file.   Social History Main Topics  . Smoking status: Never Smoker   . Smokeless tobacco: Never Used     Comment: tobacco use- no   . Alcohol Use: No  . Drug Use: No  . Sexual Activity: Not on file   Other Topics Concern  . Not on file   Social History Narrative   Regularly exercise. Single. Full time.     Allergies  Allergen Reactions  . Clopidogrel Bisulfate     REACTION: rash    Review of Systems  Constitutional: Negative.   HENT: Negative.   Eyes: Negative.   Respiratory: Negative.   Cardiovascular: Negative.   Gastrointestinal: Negative.   Genitourinary: Negative.   Musculoskeletal: Negative.   Skin: Negative.   Neurological: Negative.   Endo/Heme/Allergies: Negative.   Psychiatric/Behavioral: Negative.     Immunization History  Administered Date(s) Administered  . Pneumococcal Polysaccharide-23 03/18/2011  . Tdap 10/29/2012   Objective:  BP 128/70 mmHg  Pulse 82  Temp(Src) 98.6 F (37 C) (Oral)  Resp 18  Wt 191 lb (86.637 kg)  Physical Exam  Constitutional: She is oriented to person, place, and time and well-developed, well-nourished, and in no distress.  Eyes: Conjunctivae and EOM are normal. Pupils are equal, round, and reactive to light.  Neck: Normal range of motion. Neck supple.  Cardiovascular: Normal rate, regular rhythm, normal heart sounds and intact distal pulses.   Pulmonary/Chest: Effort normal and breath sounds normal.  Neurological: She is alert and oriented to person, place, and time. She has normal reflexes. Gait normal. GCS score is 15.  Skin: Skin is warm and dry.  Psychiatric: Mood, memory, affect and judgment normal.  Pt tearful when discussing her 2 deaths in the family.    Lab Results  Component Value Date   WBC 6.9 06/19/2015   HGB 12.5 01/23/2015   HCT 40.3 06/19/2015   PLT 354 06/19/2015   GLUCOSE 136* 06/19/2015   CHOL 1 01/23/2015   TRIG 100  01/23/2015   HDL 66 01/23/2015   LDLCALC 62 01/23/2015   TSH 0.396* 06/19/2015   INR 0.95 01/15/2010   HGBA1C 7.1 05/03/2015    CMP     Component Value Date/Time   NA 140 06/19/2015 1506   NA 136 01/24/2010 0330   K 4.9 06/19/2015 1506   CL 99 06/19/2015 1506   CO2 24 06/19/2015 1506   GLUCOSE 136* 06/19/2015 1506   GLUCOSE 166* 01/24/2010 0330   BUN 20 06/19/2015 1506   BUN 7 01/24/2010 0330   CREATININE 1.04* 06/19/2015 1506   CREATININE 1.0 01/23/2015   CALCIUM 9.9 06/19/2015 1506   PROT 8.2 06/19/2015 1506   ALBUMIN 4.8 06/19/2015 1506   AST 17 06/19/2015 1506   ALT 19 06/19/2015 1506   ALKPHOS 110 06/19/2015 1506   BILITOT 0.2 06/19/2015 1506   GFRNONAA 60 06/19/2015 1506   GFRAA 69 06/19/2015 1506    Assessment and Plan :  1. Uncontrolled type 2 diabetes mellitus with complication, without  long-term current use of insulin (HCC)  - POCT HgB A1C 8.3 today. Worse but Pt has been through tragic lose of 2 family members. Follow.    2. HYPERTENSION, BENIGN   3. Hypercholesteremia Check lipids next OV.  4. Thyroid disease - TSH   Patient was seen and examined by Dr. Alaine Loughney Garlan Drewes,Julieanne Mansoncribed by Dimas Chyle, CMA  Julieanne Manson MD [facility] 11/06/2015 3:40 PM

## 2015-11-07 LAB — TSH: TSH: 0.531 u[IU]/mL (ref 0.450–4.500)

## 2015-11-14 ENCOUNTER — Telehealth: Payer: Self-pay

## 2015-11-14 NOTE — Telephone Encounter (Signed)
No anwer, no voicemail set up, the second number on file is incorrect-aa

## 2015-11-14 NOTE — Telephone Encounter (Signed)
-----   Message from Maple Hudson., MD sent at 11/08/2015  8:21 AM EST ----- Thyroid level okay.

## 2015-11-17 NOTE — Telephone Encounter (Signed)
Pt advised-aa 

## 2015-11-21 ENCOUNTER — Ambulatory Visit (INDEPENDENT_AMBULATORY_CARE_PROVIDER_SITE_OTHER): Payer: BLUE CROSS/BLUE SHIELD | Admitting: Family Medicine

## 2015-11-21 ENCOUNTER — Encounter: Payer: Self-pay | Admitting: Family Medicine

## 2015-11-21 VITALS — BP 124/70 | HR 78 | Temp 98.7°F | Resp 16 | Wt 192.0 lb

## 2015-11-21 DIAGNOSIS — R109 Unspecified abdominal pain: Secondary | ICD-10-CM

## 2015-11-21 DIAGNOSIS — M545 Low back pain: Secondary | ICD-10-CM | POA: Diagnosis not present

## 2015-11-21 LAB — POCT URINALYSIS DIPSTICK
BILIRUBIN UA: NEGATIVE
GLUCOSE UA: 1000
Ketones, UA: NEGATIVE
LEUKOCYTES UA: NEGATIVE
NITRITE UA: NEGATIVE
RBC UA: NEGATIVE
SPEC GRAV UA: 1.015
UROBILINOGEN UA: 0.2
pH, UA: 6

## 2015-11-21 MED ORDER — HYDROCODONE-ACETAMINOPHEN 5-325 MG PO TABS: 1.0000 | ORAL_TABLET | ORAL | Status: DC | PRN

## 2015-11-21 NOTE — Patient Instructions (Signed)
Heating pad and pain medication and follow up this Thursday afternoon.

## 2015-11-21 NOTE — Progress Notes (Signed)
Patient ID: Kelly Manning, female   DOB: [birth year], 60 y.o.   MRN: [id number]    Subjective:  HPI Pt is here today because she started having back/ side pain about 4 days ago. It is located on her right side just below her rib and around to her back. She reports that she has felt a "knot" in that area that has seem to have gotten smaller since yesterday. She does not know of anything that she could've done to it other than maybe her job. 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 She reports that the pain is better today since she took off last night and rested and taken Tylenol. She denies any urinary symptoms. Believes it could possibly be muscular but wants to make sure. She reports that the pain was almost unbearable when it started. She reports that the area is tender with palpation and reproduces the pain. Denies a skin rash.   Prior to Admission medications   Medication Sig Start Date End Date Taking? Authorizing Provider  aspirin 81 MG tablet Take by mouth.   Yes Historical Provider, MD  atorvastatin (LIPITOR) 40 MG tablet TAKE ONE TABLET BY MOUTH EVERY DAY 03/17/15  Yes Richard Maceo Pro., MD  Blood Glucose Monitoring Suppl (ONE TOUCH ULTRA SYSTEM KIT) w/Device KIT 1 kit by Does not apply route 2 (two) times daily. Test strips only. Dx: E11.8 11/06/15  Yes Richard Maceo Pro., MD  canagliflozin [facility]) 100 MG TABS tablet Take 1 tablet (100 mg total) by mouth daily. 03/01/15  Yes Richard Maceo Pro., MD  glipiZIDE (GLUCOTROL) 5 MG tablet Take 1 tablet (5 mg total) by mouth daily. 05/17/15  Yes Richard Maceo Pro., MD  lisinopril (PRINIVIL,ZESTRIL) 5 MG tablet Take 1 tablet (5 mg total) by mouth daily. 11/06/15  Yes Richard Maceo Pro., MD  ONE TOUCH ULTRA TEST test strip 2 (two) times daily. for testing 11/06/15  Yes Historical Provider, MD  sitaGLIPtin-metformin (JANUMET) 50-1000 MG per tablet Take by mouth. 12/28/14  Yes Historical Provider, MD    Patient Active Problem  List   Diagnosis Date Noted  . Calcium blood increased 02/24/2015  . Hypercholesteremia 02/24/2015  . Microalbuminuria 02/24/2015  . Adiposity 02/24/2015  . HYPERLIPIDEMIA TYPE IIB / III 02/15/2010  . HYPERTENSION, BENIGN 02/15/2010  . CAD, NATIVE VESSEL 02/15/2010  . CAD in native artery 01/24/2010  . Diabetes mellitus type 2 with complications, uncontrolled ([employer]) 12/26/2009  . Chest pain 12/26/2009  . Family history of cardiovascular disease 12/26/2009  . Diabetes ([place]) 05/15/2009    Past Medical History  Diagnosis Date  . DM2 (diabetes mellitus, type 2) ([place])   . Hyperlipidemia   . CAD (coronary artery disease)     -s/p DES to LAD in May 2011  . Hypertension     Social History   Social History  . Marital Status: Single    Spouse Name: N/A  . Number of Children: N/A  . Years of Education: N/A   Occupational History  . Not on file.   Social History Main Topics  . Smoking status: Never Smoker   . Smokeless tobacco: Never Used     Comment: tobacco use- no   . Alcohol Use: No  . Drug Use: No  . Sexual Activity: Not on file   Other Topics Concern  . Not on file   Social History Narrative   Regularly exercise. Single. Full time.     Allergies  Allergen Reactions  . Clopidogrel Bisulfate     REACTION: rash    Review of Systems  Constitutional: Negative.   HENT: Negative.   Eyes: Negative.   Respiratory: Negative.   Cardiovascular: Negative.   Gastrointestinal: Negative.   Genitourinary: Negative.   Musculoskeletal: Positive for back pain.  Skin: Negative.   Neurological: Negative.   Endo/Heme/Allergies: Negative.   Psychiatric/Behavioral: Negative.     Immunization History  Administered Date(s) Administered  . Pneumococcal Polysaccharide-23 03/18/2011  . Tdap 10/29/2012   Objective:  BP 124/70 mmHg  Pulse 78  Temp(Src) 98.7 F (37.1 C) (Oral)  Resp 16  Wt 192 lb (87.091 kg)  Physical Exam  Constitutional: She is oriented to person,  place, and time and well-developed, well-nourished, and in no distress.  HENT:  Head: Normocephalic and atraumatic.  Right Ear: External ear normal.  Left Ear: External ear normal.  Nose: Nose normal.  Eyes: Conjunctivae and EOM are normal. Pupils are equal, round, and reactive to light.  Cardiovascular: Normal rate, regular rhythm, normal heart sounds and intact distal pulses.   Pulmonary/Chest: Effort normal and breath sounds normal.  Abdominal: Soft. Bowel sounds are normal.  Musculoskeletal: She exhibits tenderness (right lower lateral ribs just lateral to right CVA area).  Neurological: She is alert and oriented to person, place, and time. She has normal reflexes. Gait normal. GCS score is 15.  Skin: Skin is warm and dry.  Psychiatric: Mood, memory, affect and judgment normal.    Lab Results  Component Value Date   WBC 6.9 06/19/2015   HGB 12.5 01/23/2015   HCT 40.3 06/19/2015   PLT 354 06/19/2015   GLUCOSE 136* 06/19/2015   CHOL 1 01/23/2015   TRIG 100 01/23/2015   HDL 66 01/23/2015   LDLCALC 62 01/23/2015   TSH 0.531 11/06/2015   INR 0.95 01/15/2010   HGBA1C 8.3 11/06/2015    CMP     Component Value Date/Time   NA 140 06/19/2015 1506   NA 136 01/24/2010 0330   K 4.9 06/19/2015 1506   CL 99 06/19/2015 1506   CO2 24 06/19/2015 1506   GLUCOSE 136* 06/19/2015 1506   GLUCOSE 166* 01/24/2010 0330   BUN 20 06/19/2015 1506   BUN 7 01/24/2010 0330   CREATININE 1.04* 06/19/2015 1506   CREATININE 1.0 01/23/2015   CALCIUM 9.9 06/19/2015 1506   PROT 8.2 06/19/2015 1506   ALBUMIN 4.8 06/19/2015 1506   AST 17 06/19/2015 1506   ALT 19 06/19/2015 1506   ALKPHOS 110 06/19/2015 1506   BILITOT 0.2 06/19/2015 1506   GFRNONAA 60 06/19/2015 1506   GFRAA 69 06/19/2015 1506    Assessment and Plan :  1. Low back pain, unspecified back pain laterality, with sciatica presence unspecified Presently appears to be musculoskeletal. - POCT urinalysis dipstick -  HYDROcodone-acetaminophen (NORCO/VICODIN) 5-325 MG tablet; Take 1 tablet by mouth every 4 (four) hours as needed for moderate pain. 1/2-1 tablets  Dispense: 45 tablet; Refill: 0 RTC 2 days. 2. Flank pain  - CBC with Differential/Platelet - Comprehensive metabolic panel 3. Status post partial hysterectomy 4. Status post cardiac stenting for CAD I have done the exam and reviewed the above chart and it is accurate to the best of my knowledge.  Patient was seen and examined by Dr. Miguel Aschoff, and noted scribed by Webb Laws, [place] MD [facility] 11/21/2015 2:54 PM

## 2015-11-22 LAB — CBC WITH DIFFERENTIAL/PLATELET
BASOS: 0 %
Basophils Absolute: 0 10*3/uL (ref 0.0–0.2)
EOS (ABSOLUTE): 0.1 10*3/uL (ref 0.0–0.4)
EOS: 1 %
HEMATOCRIT: 40.1 % (ref 34.0–46.6)
HEMOGLOBIN: 13.7 g/dL (ref 11.1–15.9)
IMMATURE GRANS (ABS): 0 10*3/uL (ref 0.0–0.1)
Immature Granulocytes: 1 %
LYMPHS ABS: 2.2 10*3/uL (ref 0.7–3.1)
LYMPHS: 32 %
MCH: 30.7 pg (ref 26.6–33.0)
MCHC: 34.2 g/dL (ref 31.5–35.7)
MCV: 90 fL (ref 79–97)
MONOCYTES: 6 %
Monocytes Absolute: 0.4 10*3/uL (ref 0.1–0.9)
NEUTROS ABS: 4 10*3/uL (ref 1.4–7.0)
Neutrophils: 60 %
Platelets: 322 10*3/uL (ref 150–379)
RBC: 4.46 x10E6/uL (ref 3.77–5.28)
RDW: 13.2 % (ref 12.3–15.4)
WBC: 6.7 10*3/uL (ref 3.4–10.8)

## 2015-11-22 LAB — COMPREHENSIVE METABOLIC PANEL
A/G RATIO: 1.4 (ref 1.1–2.5)
ALBUMIN: 4.6 g/dL (ref 3.5–5.5)
ALT: 18 IU/L (ref 0–32)
AST: 19 IU/L (ref 0–40)
Alkaline Phosphatase: 111 IU/L (ref 39–117)
BUN / CREAT RATIO: 27 — AB (ref 9–23)
BUN: 26 mg/dL — AB (ref 6–24)
CHLORIDE: 101 mmol/L (ref 96–106)
CO2: 21 mmol/L (ref 18–29)
Calcium: 10.2 mg/dL (ref 8.7–10.2)
Creatinine, Ser: 0.97 mg/dL (ref 0.57–1.00)
GFR calc non Af Amer: 65 mL/min/{1.73_m2} (ref >59)
GFR, EST AFRICAN AMERICAN: 74 mL/min/{1.73_m2} (ref >59)
GLOBULIN, TOTAL: 3.2 g/dL (ref 1.5–4.5)
GLUCOSE: 102 mg/dL — AB (ref 65–99)
Potassium: 4.8 mmol/L (ref 3.5–5.2)
SODIUM: 140 mmol/L (ref 134–144)
TOTAL PROTEIN: 7.8 g/dL (ref 6.0–8.5)

## 2015-11-23 ENCOUNTER — Ambulatory Visit (INDEPENDENT_AMBULATORY_CARE_PROVIDER_SITE_OTHER): Payer: BLUE CROSS/BLUE SHIELD | Admitting: Family Medicine

## 2015-11-23 ENCOUNTER — Encounter: Payer: Self-pay | Admitting: Family Medicine

## 2015-11-23 VITALS — BP 122/74 | HR 74 | Temp 98.3°F | Resp 16 | Wt 191.0 lb

## 2015-11-23 DIAGNOSIS — R109 Unspecified abdominal pain: Secondary | ICD-10-CM | POA: Diagnosis not present

## 2015-11-23 DIAGNOSIS — M545 Low back pain: Secondary | ICD-10-CM

## 2015-11-23 DIAGNOSIS — R0989 Other specified symptoms and signs involving the circulatory and respiratory systems: Secondary | ICD-10-CM | POA: Diagnosis not present

## 2015-11-23 DIAGNOSIS — R10A0 Flank pain, unspecified side: Secondary | ICD-10-CM

## 2015-11-23 DIAGNOSIS — R0689 Other abnormalities of breathing: Secondary | ICD-10-CM

## 2015-11-23 NOTE — Progress Notes (Signed)
Patient ID: Kelly Manning, female   DOB: [birth year], 60 y.o.   MRN: [id number]    Subjective:  HPI Pt is here for a 2 day follow up of back/flank pain. She was given Norco for the pain and Met C and CBC were checked and ok.   Pt reports that the pain does seem to be better, not as intense. It is still there but not as bad. She reports that the heating pad has helped a lot. She has not developed any further symptoms at this point.   Prior to Admission medications   Medication Sig Start Date End Date Taking? Authorizing Provider  aspirin 81 MG tablet Take by mouth.   Yes Historical Provider, MD  atorvastatin (LIPITOR) 40 MG tablet TAKE ONE TABLET BY MOUTH EVERY DAY 03/17/15  Yes Nevyn Bossman Maceo Pro., MD  Blood Glucose Monitoring Suppl (ONE TOUCH ULTRA SYSTEM KIT) w/Device KIT 1 kit by Does not apply route 2 (two) times daily. Test strips only. Dx: E11.8 11/06/15  Yes Rayen Palen Maceo Pro., MD  canagliflozin [facility]) 100 MG TABS tablet Take 1 tablet (100 mg total) by mouth daily. 03/01/15  Yes Floy Angert Maceo Pro., MD  glipiZIDE (GLUCOTROL) 5 MG tablet Take 1 tablet (5 mg total) by mouth daily. 05/17/15  Yes Haylo Fake Maceo Pro., MD  HYDROcodone-acetaminophen (NORCO/VICODIN) 5-325 MG tablet Take 1 tablet by mouth every 4 (four) hours as needed for moderate pain. 1/2-1 tablets 11/21/15  Yes Alexandre Faries Maceo Pro., MD  lisinopril (PRINIVIL,ZESTRIL) 5 MG tablet Take 1 tablet (5 mg total) by mouth daily. 11/06/15  Yes Luticia Tadros Maceo Pro., MD  ONE TOUCH ULTRA TEST test strip 2 (two) times daily. for testing 11/06/15  Yes Historical Provider, MD  sitaGLIPtin-metformin (JANUMET) 50-1000 MG per tablet Take by mouth. 12/28/14  Yes Historical Provider, MD    Patient Active Problem List   Diagnosis Date Noted  . Calcium blood increased 02/24/2015  . Hypercholesteremia 02/24/2015  . Microalbuminuria 02/24/2015  . Adiposity 02/24/2015  . HYPERLIPIDEMIA TYPE IIB / III 02/15/2010  . HYPERTENSION, BENIGN  02/15/2010  . CAD, NATIVE VESSEL 02/15/2010  . CAD in native artery 01/24/2010  . Diabetes mellitus type 2 with complications, uncontrolled ([place]) 12/26/2009  . Chest pain 12/26/2009  . Family history of cardiovascular disease 12/26/2009  . Diabetes ([place]) 05/15/2009    Past Medical History  Diagnosis Date  . DM2 (diabetes mellitus, type 2) ([place])   . Hyperlipidemia   . CAD (coronary artery disease)     -s/p DES to LAD in May 2011  . Hypertension     Social History   Social History  . Marital Status: Single    Spouse Name: N/A  . Number of Children: N/A  . Years of Education: N/A   Occupational History  . Not on file.   Social History Main Topics  . Smoking status: Never Smoker   . Smokeless tobacco: Never Used     Comment: tobacco use- no   . Alcohol Use: No  . Drug Use: No  . Sexual Activity: Not on file   Other Topics Concern  . Not on file   Social History Narrative   Regularly exercise. Single. Full time.     Allergies  Allergen Reactions  . Clopidogrel Bisulfate     REACTION: rash    Review of Systems  Constitutional: Negative.   HENT: Negative.   Eyes: Negative.   Respiratory: Negative.   Cardiovascular: Negative.   Gastrointestinal: Negative.  Genitourinary: Negative.   Musculoskeletal: Positive for back pain.  Skin: Negative.   Neurological: Negative.   Endo/Heme/Allergies: Negative.   Psychiatric/Behavioral: Negative.     Immunization History  Administered Date(s) Administered  . Pneumococcal Polysaccharide-23 03/18/2011  . Tdap 10/29/2012   Objective:  BP 122/74 mmHg  Pulse 74  Temp(Src) 98.3 F (36.8 C) (Oral)  Resp 16  Wt 191 lb (86.637 kg)  Physical Exam  Constitutional: She is well-developed, well-nourished, and in no distress.  HENT:  Head: Normocephalic and atraumatic.  Right Ear: External ear normal.  Left Ear: External ear normal.  Nose: Nose normal.  Eyes: Conjunctivae are normal.  Neck: Neck supple.    Cardiovascular: Normal rate, regular rhythm and normal heart sounds.   Pulmonary/Chest: Effort normal and breath sounds normal.  Abdominal: Soft.  Skin: Skin is warm and dry. No rash noted.  Psychiatric: Mood, memory, affect and judgment normal.    Lab Results  Component Value Date   WBC 6.7 11/21/2015   HGB 12.5 01/23/2015   HCT 40.1 11/21/2015   PLT 322 11/21/2015   GLUCOSE 102* 11/21/2015   CHOL 1 01/23/2015   TRIG 100 01/23/2015   HDL 66 01/23/2015   LDLCALC 62 01/23/2015   TSH 0.531 11/06/2015   INR 0.95 01/15/2010   HGBA1C 8.3 11/06/2015    CMP     Component Value Date/Time   NA 140 11/21/2015 1536   NA 136 01/24/2010 0330   K 4.8 11/21/2015 1536   CL 101 11/21/2015 1536   CO2 21 11/21/2015 1536   GLUCOSE 102* 11/21/2015 1536   GLUCOSE 166* 01/24/2010 0330   BUN 26* 11/21/2015 1536   BUN 7 01/24/2010 0330   CREATININE 0.97 11/21/2015 1536   CREATININE 1.0 01/23/2015   CALCIUM 10.2 11/21/2015 1536   PROT 7.8 11/21/2015 1536   ALBUMIN 4.6 11/21/2015 1536   AST 19 11/21/2015 1536   ALT 18 11/21/2015 1536   ALKPHOS 111 11/21/2015 1536   BILITOT <0.2 11/21/2015 1536   GFRNONAA 65 11/21/2015 1536   GFRAA 74 11/21/2015 1536    Assessment and Plan :  1. Low back pain, unspecified back pain laterality, with sciatica presence unspecified Clinically patient is improving and this appears to be a muscular strain. Will workup as below because of physical findings. Otherwise will follow clinically.  2. Flank pain  - DG Chest 2 View  3. Decreased breath sounds  - DG Chest 2 View I have done the exam and reviewed the above chart and it is accurate to the best of my knowledge.   Miguel Aschoff MD [facility] 11/23/2015 8:15 AM

## 2015-12-12 ENCOUNTER — Encounter: Payer: Self-pay | Admitting: Family Medicine

## 2016-01-20 ENCOUNTER — Other Ambulatory Visit: Payer: Self-pay | Admitting: Family Medicine

## 2016-01-23 ENCOUNTER — Other Ambulatory Visit: Payer: Self-pay | Admitting: Family Medicine

## 2016-01-23 DIAGNOSIS — Z1231 Encounter for screening mammogram for malignant neoplasm of breast: Secondary | ICD-10-CM

## 2016-01-31 ENCOUNTER — Encounter: Payer: Self-pay | Admitting: Cardiovascular Disease

## 2016-01-31 ENCOUNTER — Ambulatory Visit (INDEPENDENT_AMBULATORY_CARE_PROVIDER_SITE_OTHER): Payer: BLUE CROSS/BLUE SHIELD | Admitting: Cardiovascular Disease

## 2016-01-31 VITALS — BP 110/70 | HR 75 | Ht 66.5 in | Wt 189.2 lb

## 2016-01-31 DIAGNOSIS — E118 Type 2 diabetes mellitus with unspecified complications: Secondary | ICD-10-CM

## 2016-01-31 DIAGNOSIS — I10 Essential (primary) hypertension: Secondary | ICD-10-CM

## 2016-01-31 DIAGNOSIS — E7800 Pure hypercholesterolemia, unspecified: Secondary | ICD-10-CM | POA: Diagnosis not present

## 2016-01-31 DIAGNOSIS — I25119 Atherosclerotic heart disease of native coronary artery with unspecified angina pectoris: Secondary | ICD-10-CM | POA: Diagnosis not present

## 2016-01-31 DIAGNOSIS — E1165 Type 2 diabetes mellitus with hyperglycemia: Secondary | ICD-10-CM

## 2016-01-31 DIAGNOSIS — IMO0002 Reserved for concepts with insufficient information to code with codable children: Secondary | ICD-10-CM

## 2016-01-31 NOTE — Progress Notes (Signed)
Patient ID: Kelly Manning, female    DOB: 1956/09/05, 60 y.o.   MRN: [id number]  HPI Comments: Kelly Manning is a 60 y/o woman with h/o DM2, hyperlipidemia, CAD with stent to her LAD in May 2011, who presents for routine followup of her coronary artery disease and diabetes.    in follow-up, she reports that she is doing well with no complaints Diabetes numbers have been poorly controlled, prior hemoglobin A1c of 8 Since then her weight is down 9 pounds. She is trying to eat better Works third shift at [employer], on her feet all day She has not had any shortness of breath or chest pain with exertion No regular exercise program  EKG on today's visit shows normal sinus rhythm with rate 75 bpm, no significant ST or T-wave changes  No recent lipid panel available  Other past medical history  CP in 2011, cardiac cath  which showed EF 55% with 90-95% lesion in LAD. RCA and LCX normal. Underwent Promus DES to LAD and balloon angioplasty of the diagonal. Stress test August 2012 showing no perfusion abnormality, low risk scan, normal ejection fraction  Prior lab work shows total cholesterol greater than 170, LDL in the 90 range       Allergies  Allergen Reactions  . Clopidogrel Bisulfate     REACTION: rash    Outpatient Encounter Prescriptions as of 01/31/2016  Medication Sig  . aspirin 81 MG tablet Take by mouth.  Marland Kitchen atorvastatin (LIPITOR) 40 MG tablet TAKE ONE TABLET BY MOUTH EVERY DAY  . Blood Glucose Monitoring Suppl (ONE TOUCH ULTRA SYSTEM KIT) w/Device KIT 1 kit by Does not apply route 2 (two) times daily. Test strips only. Dx: E11.8  . canagliflozin (INVOKANA) 100 MG TABS tablet Take 1 tablet (100 mg total) by mouth daily.  Marland Kitchen glipiZIDE (GLUCOTROL) 5 MG tablet Take 1 tablet (5 mg total) by mouth daily.  Marland Kitchen HYDROcodone-acetaminophen (NORCO/VICODIN) 5-325 MG tablet Take 1 tablet by mouth every 4 (four) hours as needed for moderate pain. 1/2-1 tablets  . JANUMET 50-1000 MG  tablet TAKE ONE TABLET BY MOUTH TWICE DAILY  . lisinopril (PRINIVIL,ZESTRIL) 5 MG tablet Take 1 tablet (5 mg total) by mouth daily.  . ONE TOUCH ULTRA TEST test strip 2 (two) times daily. for testing   No facility-administered encounter medications on file as of 01/31/2016.    Past Medical History  Diagnosis Date  . DM2 (diabetes mellitus, type 2) ([place])   . Hyperlipidemia   . CAD (coronary artery disease)     -s/p DES to LAD in May 2011  . Hypertension     Past Surgical History  Procedure Laterality Date  . Partial hysterectomy    . Dilation and curettage of uterus    . Cardiac catheterization      Social History  reports that she has never smoked. She has never used smokeless tobacco. She reports that she does not drink alcohol or use illicit drugs.  Family History family history includes Cancer in her brother; Coronary artery disease in her mother; Diabetes in her brother, brother, brother, brother, brother, brother, mother, sister, sister, sister, sister, and sister; Heart failure in her brother and sister; Ovarian cancer in her mother.  Review of Systems  Constitutional: Negative.   Respiratory: Negative.   Cardiovascular: Negative.   Gastrointestinal: Negative.   Musculoskeletal: Negative.   Skin: Negative.   Neurological: Negative.   Hematological: Negative.   Psychiatric/Behavioral: Negative.   All other systems reviewed  and are negative.   BP 110/70 mmHg  Pulse 75  Ht 5' 6.5" (1.689 m)  Wt 189 lb 4 oz (85.843 kg)  BMI 30.09 kg/m2  Physical Exam  Constitutional: She is oriented to person, place, and time. She appears well-developed and well-nourished.  HENT:  Head: Normocephalic.  Nose: Nose normal.  Mouth/Throat: Oropharynx is clear and moist.  Eyes: Conjunctivae are normal. Pupils are equal, round, and reactive to light.  Neck: Normal range of motion. Neck supple. No JVD present.  Cardiovascular: Normal rate, regular rhythm, S1 normal, S2 normal, normal  heart sounds and intact distal pulses.  Exam reveals no gallop and no friction rub.   No murmur heard. Pulmonary/Chest: Effort normal and breath sounds normal. No respiratory distress. She has no wheezes. She has no rales. She exhibits no tenderness.  Abdominal: Soft. Bowel sounds are normal. She exhibits no distension. There is no tenderness.  Musculoskeletal: Normal range of motion. She exhibits no edema or tenderness.  Lymphadenopathy:    She has no cervical adenopathy.  Neurological: She is alert and oriented to person, place, and time. Coordination normal.  Skin: Skin is warm and dry. No rash noted. No erythema.  Psychiatric: She has a normal mood and affect. Her behavior is normal. Judgment and thought content normal.    Assessment and Plan  Nursing note and vitals reviewed.

## 2016-01-31 NOTE — Assessment & Plan Note (Signed)
Cholesterol is at goal on the current lipid regimen. No changes to the medications were made.  

## 2016-01-31 NOTE — Assessment & Plan Note (Signed)
We have encouraged continued exercise, careful diet management in an effort to lose weight. 

## 2016-01-31 NOTE — Patient Instructions (Signed)
You are doing well. No medication changes were made.  Please call us if you have new issues that need to be addressed before your next appt.  Your physician wants you to follow-up in: 12 months.  You will receive a reminder letter in the mail two months in advance. If you don't receive a letter, please call our office to schedule the follow-up appointment. 

## 2016-01-31 NOTE — Assessment & Plan Note (Signed)
Currently with no symptoms of angina. No further workup at this time. Continue current medication regimen. 

## 2016-01-31 NOTE — Assessment & Plan Note (Signed)
Blood pressure is well controlled on today's visit. No changes made to the medications. 

## 2016-02-08 ENCOUNTER — Ambulatory Visit
Admission: RE | Admit: 2016-02-08 | Discharge: 2016-02-08 | Disposition: A | Discharge status: Home / Self Care | Payer: BLUE CROSS/BLUE SHIELD | Source: Ambulatory Visit | Attending: Family Medicine | Admitting: Family Medicine

## 2016-02-08 DIAGNOSIS — Z1231 Encounter for screening mammogram for malignant neoplasm of breast: Secondary | ICD-10-CM | POA: Insufficient documentation

## 2016-03-04 ENCOUNTER — Ambulatory Visit (INDEPENDENT_AMBULATORY_CARE_PROVIDER_SITE_OTHER): Payer: BLUE CROSS/BLUE SHIELD | Admitting: Family Medicine

## 2016-03-04 VITALS — BP 118/72 | HR 72 | Temp 98.1°F | Resp 16 | Wt 190.0 lb

## 2016-03-04 DIAGNOSIS — IMO0002 Reserved for concepts with insufficient information to code with codable children: Secondary | ICD-10-CM

## 2016-03-04 DIAGNOSIS — E1165 Type 2 diabetes mellitus with hyperglycemia: Secondary | ICD-10-CM | POA: Diagnosis not present

## 2016-03-04 DIAGNOSIS — I10 Essential (primary) hypertension: Secondary | ICD-10-CM | POA: Diagnosis not present

## 2016-03-04 DIAGNOSIS — E118 Type 2 diabetes mellitus with unspecified complications: Secondary | ICD-10-CM

## 2016-03-04 DIAGNOSIS — E7800 Pure hypercholesterolemia, unspecified: Secondary | ICD-10-CM

## 2016-03-04 MED ORDER — ONETOUCH ULTRA BLUE VI STRP: 1.0000 | ORAL_STRIP | Freq: Two times a day (BID) | Status: AC

## 2016-03-04 NOTE — Progress Notes (Signed)
Patient ID: Kelly Manning, female   DOB: October 28, 1955, 60 y.o.   MRN: [id number]   Kelly Manning  MRN: [id number] DOB: [birth year]  Subjective:  HPI   1. Uncontrolled type 2 diabetes mellitus with complication, without long-term current use of insulin [facility]) The patient is a 60 year old female who presents for follow up of her diabetes.  Her last visit was 11/23/15.  Her A1C at that time was 8.3.  When she has her glucose checked at home and at work she gets readings that range 80's-230's.  She has had no hypoglycemic events or symptoms.  2. HYPERTENSION, BENIGN She is also here for follow up on her blood pressure.  She states when she has this checked outside of the office she gets readings in the range of ours today.  3. Hypercholesteremia It is time for the patient to have her cholesterol checked.  She states that when she saw Dr. Rockey Situ last he requested we get this done with our labs today.    4.The patient notes that she is doing better with her back pain and has discontinued her pain medications. 5.Job stress--The patient is working many overtime hours and hopes to cut back on that this year.  Patient Active Problem List   Diagnosis Date Noted  . Calcium blood increased 02/24/2015  . Hypercholesteremia 02/24/2015  . Microalbuminuria 02/24/2015  . Adiposity 02/24/2015  . HYPERLIPIDEMIA TYPE IIB / III 02/15/2010  . HYPERTENSION, BENIGN 02/15/2010  . CAD, NATIVE VESSEL 02/15/2010  . CAD in native artery 01/24/2010  . Diabetes mellitus type 2 with complications, uncontrolled ([place]) 12/26/2009  . Chest pain 12/26/2009  . Family history of cardiovascular disease 12/26/2009  . Diabetes ([place]) 05/15/2009    Past Medical History  Diagnosis Date  . DM2 (diabetes mellitus, type 2) ([place])   . Hyperlipidemia   . CAD (coronary artery disease)     -s/p DES to LAD in May 2011  . Hypertension     Social History   Social History  . Marital Status: Single    Spouse Name: N/A  .  Number of Children: N/A  . Years of Education: N/A   Occupational History  . Not on file.   Social History Main Topics  . Smoking status: Never Smoker   . Smokeless tobacco: Never Used     Comment: tobacco use- no   . Alcohol Use: No  . Drug Use: No  . Sexual Activity: Not on file   Other Topics Concern  . Not on file   Social History Narrative   Regularly exercise. Single. Full time.     Outpatient Prescriptions Prior to Visit  Medication Sig Dispense Refill  . aspirin 81 MG tablet Take by mouth.    Marland Kitchen atorvastatin (LIPITOR) 40 MG tablet TAKE ONE TABLET BY MOUTH EVERY DAY 30 tablet 12  . Blood Glucose Monitoring Suppl (ONE TOUCH ULTRA SYSTEM KIT) w/Device KIT 1 kit by Does not apply route 2 (two) times daily. Test strips only. Dx: E11.8 100 each 12  . canagliflozin (INVOKANA) 100 MG TABS tablet Take 1 tablet (100 mg total) by mouth daily. 30 tablet 12  . glipiZIDE (GLUCOTROL) 5 MG tablet Take 1 tablet (5 mg total) by mouth daily. 30 tablet 12  . JANUMET 50-1000 MG tablet TAKE ONE TABLET BY MOUTH TWICE DAILY 60 tablet 12  . lisinopril (PRINIVIL,ZESTRIL) 5 MG tablet Take 1 tablet (5 mg total) by mouth daily. 30 tablet 12  . ONE TOUCH ULTRA  TEST test strip 2 (two) times daily. for testing  12  . HYDROcodone-acetaminophen (NORCO/VICODIN) 5-325 MG tablet Take 1 tablet by mouth every 4 (four) hours as needed for moderate pain. 1/2-1 tablets 45 tablet 0   No facility-administered medications prior to visit.    Allergies  Allergen Reactions  . Clopidogrel Bisulfate     REACTION: rash    Review of Systems  Constitutional: Negative for fever and malaise/fatigue.  Respiratory: Negative for cough, shortness of breath and wheezing.   Cardiovascular: Negative for chest pain, palpitations, orthopnea, claudication, leg swelling and PND.  Neurological: Negative for dizziness, weakness and headaches.   Objective:  BP 118/72 mmHg  Pulse 72  Temp(Src) 98.1 F (36.7 C) (Oral)  Resp 16   Wt 190 lb (86.183 kg)  Physical Exam  Constitutional: She is oriented to person, place, and time and well-developed, well-nourished, and in no distress.  HENT:  Head: Normocephalic.  Eyes: Pupils are equal, round, and reactive to light.  Neck: Normal range of motion.  Thickening on the left side  Cardiovascular: Normal rate, regular rhythm and normal heart sounds.   Pulmonary/Chest: Effort normal and breath sounds normal.  Neurological: She is alert and oriented to person, place, and time. Gait normal.  Skin: Skin is warm and dry.  Psychiatric: Mood, memory, affect and judgment normal.    Diabetic Foot Exam - Simple   Simple Foot Form  Diabetic Foot exam was performed with the following findings:  Yes   Visual Inspection  No deformities, no ulcerations, no other skin breakdown bilaterally:  Yes  Sensation Testing  Intact to touch and monofilament testing bilaterally:  Yes  Pulse Check  Posterior Tibialis and Dorsalis pulse intact bilaterally:  Yes  Comments       Assessment and Plan :   1. Uncontrolled type 2 diabetes mellitus with complication, without long-term current use of insulin (HCC) If A1c is above 9 and will add medication to her regimen. Diet and exercise distress. - Hemoglobin A1c - ONE TOUCH ULTRA TEST test strip; 1 each by Other route 2 (two) times daily. for testing  Dispense: 100 each; Refill: 12 Diabetic foot exam today is normal 2. HYPERTENSION, BENIGN  Excellent control.  3. Hypercholesteremia Ideal LDL goal is less than 70, basic LDL goal less than 100. - Lipid Panel With LDL/HDL Ratio 4. Adjustment reaction Patient is having a lot of job stress due to overtime hours but she seems to be handling this on her own. 5. Musculoskeletal back pain Resolved. Patient was seen and examined by Dr. Delfino Lovett L. [place] Mon and the note was scribed by Althea Charon, RMA.   Miguel Aschoff MD [facility] 03/04/2016 8:34  AM

## 2016-03-05 LAB — HEMOGLOBIN A1C
ESTIMATED AVERAGE GLUCOSE: 223 mg/dL
Hgb A1c MFr Bld: 9.4 % — ABNORMAL HIGH (ref 4.8–5.6)

## 2016-03-05 LAB — LIPID PANEL WITH LDL/HDL RATIO
Cholesterol, Total: 144 mg/dL (ref 100–199)
HDL: 66 mg/dL (ref >39)
LDL Calculated: 65 mg/dL (ref 0–99)
LDl/HDL Ratio: 1 ratio units (ref 0.0–3.2)
Triglycerides: 67 mg/dL (ref 0–149)
VLDL CHOLESTEROL CAL: 13 mg/dL (ref 5–40)

## 2016-03-08 ENCOUNTER — Telehealth: Payer: Self-pay

## 2016-03-08 DIAGNOSIS — E1165 Type 2 diabetes mellitus with hyperglycemia: Secondary | ICD-10-CM

## 2016-03-08 DIAGNOSIS — E118 Type 2 diabetes mellitus with unspecified complications: Principal | ICD-10-CM

## 2016-03-08 MED ORDER — CANAGLIFLOZIN 300 MG PO TABS: 300.0000 mg | ORAL_TABLET | Freq: Every day | ORAL | Status: DC

## 2016-03-08 NOTE — Telephone Encounter (Signed)
-----   Message from Maple Hudsonichard L Gilbert Jr., MD sent at 03/07/2016 10:08 AM EDT ----- Increase Invokana from 100 mg to 300 mg daily

## 2016-03-08 NOTE — Telephone Encounter (Signed)
Pt advised and RX sent in-aa 

## 2016-03-24 ENCOUNTER — Other Ambulatory Visit: Payer: Self-pay | Admitting: Family Medicine

## 2016-06-09 ENCOUNTER — Other Ambulatory Visit: Payer: Self-pay | Admitting: Family Medicine

## 2016-07-08 ENCOUNTER — Ambulatory Visit (INDEPENDENT_AMBULATORY_CARE_PROVIDER_SITE_OTHER): Payer: BLUE CROSS/BLUE SHIELD | Admitting: Family Medicine

## 2016-07-08 VITALS — BP 108/62 | HR 68 | Temp 98.6°F | Resp 14 | Wt 187.0 lb

## 2016-07-08 DIAGNOSIS — E7800 Pure hypercholesterolemia, unspecified: Secondary | ICD-10-CM

## 2016-07-08 DIAGNOSIS — E1165 Type 2 diabetes mellitus with hyperglycemia: Secondary | ICD-10-CM

## 2016-07-08 DIAGNOSIS — I10 Essential (primary) hypertension: Secondary | ICD-10-CM

## 2016-07-08 DIAGNOSIS — IMO0002 Reserved for concepts with insufficient information to code with codable children: Secondary | ICD-10-CM

## 2016-07-08 DIAGNOSIS — R232 Flushing: Secondary | ICD-10-CM | POA: Diagnosis not present

## 2016-07-08 DIAGNOSIS — E118 Type 2 diabetes mellitus with unspecified complications: Secondary | ICD-10-CM

## 2016-07-08 DIAGNOSIS — Z23 Encounter for immunization: Secondary | ICD-10-CM

## 2016-07-08 LAB — POCT UA - MICROALBUMIN: Microalbumin Ur, POC: 20 mg/L

## 2016-07-08 LAB — POCT GLYCOSYLATED HEMOGLOBIN (HGB A1C): Hemoglobin A1C: 8.2

## 2016-07-08 MED ORDER — GLIPIZIDE 5 MG PO TABS: 5.0000 mg | ORAL_TABLET | Freq: Every day | ORAL | 12 refills | Status: DC

## 2016-07-08 NOTE — Progress Notes (Signed)
Kelly Manning  MRN: [id number] DOB: 24-Apr-1956  Subjective:  HPI  Patient is here for follow up  Diabetes: patient check her sugar and numbers have been fluctuating 120s, 130s, 202, and sometimes has hypoglycemic episodes and feels like she is having this episode but may not have her meter to check it. At her last visit in June Invokana was increased to 300 mg but she is taking 1/2 tablet twice dialy now due to when she was taking 1 whole tablet daily at once she felt "foggy". She does get numbness in tips of her fingers usually on the left side. She is due for eye exam and she will get that scheduled. Lab Results  Component Value Date   HGBA1C 9.4 (H) 03/04/2016   Hypertension: she gets this checked at work at times but not often and does not recall the readings.  BP Readings from Last 3 Encounters:  07/08/16 108/62  03/04/16 118/72  01/31/16 110/70   Hyperlipidemia: Lab Results  Component Value Date   CHOL 144 03/04/2016   HDL 66 03/04/2016   LDLCALC 65 03/04/2016   TRIG 67 03/04/2016    Patient Active Problem List   Diagnosis Date Noted  . Calcium blood increased 02/24/2015  . Hypercholesteremia 02/24/2015  . Microalbuminuria 02/24/2015  . Adiposity 02/24/2015  . HYPERLIPIDEMIA TYPE IIB / III 02/15/2010  . HYPERTENSION, BENIGN 02/15/2010  . CAD, NATIVE VESSEL 02/15/2010  . CAD in native artery 01/24/2010  . Diabetes mellitus type 2 with complications, uncontrolled ([place]) 12/26/2009  . Chest pain 12/26/2009  . Family history of cardiovascular disease 12/26/2009  . Diabetes ([place]) 05/15/2009    Past Medical History:  Diagnosis Date  . CAD (coronary artery disease)    -s/p DES to LAD in May 2011  . DM2 (diabetes mellitus, type 2) ([place])   . Hyperlipidemia   . Hypertension     Social History   Social History  . Marital status: Single    Spouse name: N/A  . Number of children: N/A  . Years of education: N/A   Occupational History  . Not on file.    Social History Main Topics  . Smoking status: Never Smoker  . Smokeless tobacco: Never Used     Comment: tobacco use- no   . Alcohol use No  . Drug use: No  . Sexual activity: Not on file   Other Topics Concern  . Not on file   Social History Narrative   Regularly exercise. Single. Full time.     Outpatient Encounter Prescriptions as of 07/08/2016  Medication Sig Note  . aspirin 81 MG tablet Take by mouth. 02/24/2015: Received from: [facility]  . atorvastatin (LIPITOR) 40 MG tablet TAKE 1 TABLET BY MOUTH EVERY DAY   . Blood Glucose Monitoring Suppl (ONE TOUCH ULTRA SYSTEM KIT) w/Device KIT 1 kit by Does not apply route 2 (two) times daily. Test strips only. Dx: E11.8   . canagliflozin (INVOKANA) 300 MG TABS tablet Take 1 tablet (300 mg total) by mouth daily. (Patient taking differently: Take 300 mg by mouth daily. Takes 1/2 tablet twice daily)   . glipiZIDE (GLUCOTROL) 5 MG tablet TAKE 1 TABLET BY MOUTH EVERY DAY   . JANUMET 50-1000 MG tablet TAKE ONE TABLET BY MOUTH TWICE DAILY   . lisinopril (PRINIVIL,ZESTRIL) 5 MG tablet Take 1 tablet (5 mg total) by mouth daily.   . ONE TOUCH ULTRA TEST test strip 1 each by Other route 2 (two) times daily. for  testing   . [DISCONTINUED] INVOKANA 100 MG TABS tablet TAKE 1 TABLET BY MOUTH EVERY DAY    No facility-administered encounter medications on file as of 07/08/2016.     Allergies  Allergen Reactions  . Clopidogrel Bisulfate     REACTION: rash    Review of Systems  Constitutional: Negative.        Hot flashes worsening in the last 6 months  Eyes: Negative.   Respiratory: Negative.   Cardiovascular: Negative.   Gastrointestinal: Negative.   Musculoskeletal: Negative.   Skin: Negative.   Neurological: Positive for tingling. Negative for dizziness and tremors.  Endo/Heme/Allergies: Negative.   Psychiatric/Behavioral: Negative.    Objective:  BP 108/62   Pulse 68   Temp 98.6 F (37 C)   Resp 14   Wt 187  lb (84.8 kg)   BMI 29.73 kg/m   Physical Exam  Constitutional: She is oriented to person, place, and time and well-developed, well-nourished, and in no distress.  HENT:  Head: Normocephalic and atraumatic.  Right Ear: External ear normal.  Left Ear: External ear normal.  Nose: Nose normal.  Eyes: Conjunctivae are normal. Pupils are equal, round, and reactive to light.  Neck: Normal range of motion. Neck supple.  Cardiovascular: Normal rate, regular rhythm, normal heart sounds and intact distal pulses.   No murmur heard. Pulmonary/Chest: Effort normal and breath sounds normal. No respiratory distress. She has no wheezes.  Abdominal: Soft.  Musculoskeletal: She exhibits no edema or tenderness.  Neurological: She is alert and oriented to person, place, and time. Gait normal.  Skin: Skin is warm and dry.  Psychiatric: Mood, memory, affect and judgment normal.    Assessment and Plan :  1. Uncontrolled type 2 diabetes mellitus with complication, without long-term current use of insulin (HCC) A1C 8.2. Better. Continue current medication. - POCT HgB A1C - POCT UA - Microalbumin  2. HYPERTENSION, BENIGN Stable.  3. Hypercholesteremia Stable on last check. Will check all routine labs on the next visit.  4. Need for influenza vaccination - Flu Vaccine QUAD 36+ mos PF IM (Fluarix & Fluzone Quad PF)  5. Hot flashes Worsening. Discussed with patient trying [place] [facility] and call [facility] to discuss this. May need to try hormonal therapy, will wait and follow. Discussed risks and benefits with taking hormonal therapy.  6.Diabetic Nephropathy Microalbumin of 20--pt on ACEI. HPI, Exam and A&P transcribed under direction and in the presence of Miguel Aschoff, MD. I have done the exam and reviewed the chart and it is accurate to the best of my knowledge. Miguel Aschoff M.D. [facility]

## 2016-07-08 NOTE — Patient Instructions (Signed)
[facility] Call [facility] and ask details about this.

## 2016-07-31 ENCOUNTER — Encounter: Payer: Self-pay | Admitting: Family Medicine

## 2016-11-18 ENCOUNTER — Ambulatory Visit (INDEPENDENT_AMBULATORY_CARE_PROVIDER_SITE_OTHER): Payer: BLUE CROSS/BLUE SHIELD | Admitting: Family Medicine

## 2016-11-18 VITALS — BP 122/68 | HR 66 | Temp 98.3°F | Resp 12 | Wt 190.0 lb

## 2016-11-18 DIAGNOSIS — I10 Essential (primary) hypertension: Secondary | ICD-10-CM | POA: Diagnosis not present

## 2016-11-18 DIAGNOSIS — E118 Type 2 diabetes mellitus with unspecified complications: Secondary | ICD-10-CM | POA: Diagnosis not present

## 2016-11-18 DIAGNOSIS — Z6830 Body mass index (BMI) 30.0-30.9, adult: Secondary | ICD-10-CM

## 2016-11-18 DIAGNOSIS — IMO0002 Reserved for concepts with insufficient information to code with codable children: Secondary | ICD-10-CM

## 2016-11-18 DIAGNOSIS — E1165 Type 2 diabetes mellitus with hyperglycemia: Secondary | ICD-10-CM | POA: Diagnosis not present

## 2016-11-18 DIAGNOSIS — E7800 Pure hypercholesterolemia, unspecified: Secondary | ICD-10-CM | POA: Diagnosis not present

## 2016-11-18 LAB — POCT GLYCOSYLATED HEMOGLOBIN (HGB A1C): Hemoglobin A1C: 9.3

## 2016-11-18 MED ORDER — SITAGLIPTIN PHOS-METFORMIN HCL 50-1000 MG PO TABS: 1.0000 | ORAL_TABLET | Freq: Two times a day (BID) | ORAL | 3 refills | Status: DC

## 2016-11-18 NOTE — Progress Notes (Signed)
Kelly Manning  MRN: [id number] DOB: 1956/07/07  Subjective:  HPI  Patient is here for 4 months follow up. She is checking her sugar sometimes and she was doing well before she lost her job in December and has had stress and eating not as well. Sugar reading has been around mid 200s, one time 300, these were not fasting readings. She is taking Invokana 300 mg 1/2 tablet twice daily, Glipizide 5 mg daily and Janumet 50-1000 mg 1 tablet twice daily. No numbness or tingling sensation present. Lab Results  Component Value Date   HGBA1C 8.2 07/08/2016   She is not checking her b/p. She was at work but not since she lost her job. No cardiac symptoms. BP Readings from Last 3 Encounters:  11/18/16 122/68  07/08/16 108/62  03/04/16 118/72   Depression screen PHQ 2/9 11/18/2016  Decreased Interest 1  Down, Depressed, Hopeless 1  PHQ - 2 Score 2  Altered sleeping 0  Tired, decreased energy 0  Change in appetite 0  Feeling bad or failure about yourself  0  Trouble concentrating 0  Moving slowly or fidgety/restless 0  Suicidal thoughts 0  PHQ-9 Score 2    Patient Active Problem List   Diagnosis Date Noted  . Calcium blood increased 02/24/2015  . Hypercholesteremia 02/24/2015  . Microalbuminuria 02/24/2015  . Adiposity 02/24/2015  . HYPERLIPIDEMIA TYPE IIB / III 02/15/2010  . HYPERTENSION, BENIGN 02/15/2010  . CAD, NATIVE VESSEL 02/15/2010  . CAD in native artery 01/24/2010  . Diabetes mellitus type 2 with complications, uncontrolled ([place]) 12/26/2009  . Chest pain 12/26/2009  . Family history of cardiovascular disease 12/26/2009  . Diabetes ([place]) 05/15/2009    Past Medical History:  Diagnosis Date  . CAD (coronary artery disease)    -s/p DES to LAD in May 2011  . DM2 (diabetes mellitus, type 2) ([place])   . Hyperlipidemia   . Hypertension     Social History   Social History  . Marital status: Single    Spouse name: N/A  . Number of children: N/A  . Years of  education: N/A   Occupational History  . Not on file.   Social History Main Topics  . Smoking status: Never Smoker  . Smokeless tobacco: Never Used     Comment: tobacco use- no   . Alcohol use No  . Drug use: No  . Sexual activity: Not on file   Other Topics Concern  . Not on file   Social History Narrative   Regularly exercise. Single. Full time.     Outpatient Encounter Prescriptions as of 11/18/2016  Medication Sig Note  . aspirin 81 MG tablet Take by mouth. 02/24/2015: Received from: [facility]  . atorvastatin (LIPITOR) 40 MG tablet TAKE 1 TABLET BY MOUTH EVERY DAY   . Blood Glucose Monitoring Suppl (ONE TOUCH ULTRA SYSTEM KIT) w/Device KIT 1 kit by Does not apply route 2 (two) times daily. Test strips only. Dx: E11.8   . canagliflozin (INVOKANA) 300 MG TABS tablet Take 1 tablet (300 mg total) by mouth daily. (Patient taking differently: Take 300 mg by mouth daily. Takes 1/2 tablet twice daily)   . glipiZIDE (GLUCOTROL) 5 MG tablet Take 1 tablet (5 mg total) by mouth daily.   Marland Kitchen JANUMET 50-1000 MG tablet TAKE ONE TABLET BY MOUTH TWICE DAILY   . lisinopril (PRINIVIL,ZESTRIL) 5 MG tablet Take 1 tablet (5 mg total) by mouth daily.   . ONE TOUCH ULTRA TEST test strip  1 each by Other route 2 (two) times daily. for testing    No facility-administered encounter medications on file as of 11/18/2016.     Allergies  Allergen Reactions  . Clopidogrel Bisulfate     REACTION: rash    Review of Systems  Constitutional: Negative.   Respiratory: Negative.   Cardiovascular: Negative.   Musculoskeletal: Negative.   Neurological: Negative.   Psychiatric/Behavioral:       Better    Objective:  BP 122/68   Pulse 66   Temp 98.3 F (36.8 C)   Resp 12   Wt 190 lb (86.2 kg)   BMI 30.21 kg/m   Physical Exam  Constitutional: She is oriented to person, place, and time and well-developed, well-nourished, and in no distress.  HENT:  Head: Normocephalic and  atraumatic.  Eyes: Conjunctivae are normal. Pupils are equal, round, and reactive to light.  Neck: Normal range of motion. Neck supple.  Cardiovascular: Normal rate, regular rhythm, normal heart sounds and intact distal pulses.   No murmur heard. Pulmonary/Chest: Effort normal and breath sounds normal. No respiratory distress. She has no wheezes.  Neurological: She is alert and oriented to person, place, and time.  Psychiatric: Mood, memory, affect and judgment normal.    Assessment and Plan :  1. Uncontrolled type 2 diabetes mellitus with complication, without long-term current use of insulin (HCC) A1C 9.3. Worse. Patient has not been doing well with eating and exercising due to stress after loosing her job. Discussed this is details. Advised patient will not make any changes to the medication today and will let patient work on her habits to improve her sugar. - POCT HgB A1C--9.3  2. HYPERTENSION, BENIGN Stable. Continue current medication.  3. Hypercholesteremia  4. BMI 30.0-30.9,adult Discussed in detail with patient to work on habits, eat better and exercise.  HPI, Exam and A&P transcribed under direction and in the presence of Miguel Aschoff, MD. I have done the exam and reviewed the chart and it is accurate to the best of my knowledge. Development worker, community has been used and  any errors in dictation or transcription are unintentional. Miguel Aschoff M.D. [facility]

## 2016-12-05 ENCOUNTER — Telehealth: Payer: Self-pay | Admitting: Family Medicine

## 2016-12-05 DIAGNOSIS — E1165 Type 2 diabetes mellitus with hyperglycemia: Secondary | ICD-10-CM

## 2016-12-05 DIAGNOSIS — I10 Essential (primary) hypertension: Secondary | ICD-10-CM

## 2016-12-05 DIAGNOSIS — E118 Type 2 diabetes mellitus with unspecified complications: Principal | ICD-10-CM

## 2016-12-05 NOTE — Telephone Encounter (Signed)
Pt contacted office for refill request on the following medications:  [facility] mail order.  CB#[phone]/MW  glipiZIDE (GLUCOTROL) 5 MG tablet  atorvastatin (LIPITOR) 40 MG tablet  lisinopril (PRINIVIL,ZESTRIL) 5 MG tablet  canagliflozin (INVOKANA) 300 MG TABS tablet

## 2016-12-06 MED ORDER — CANAGLIFLOZIN 300 MG PO TABS: 300.0000 mg | ORAL_TABLET | Freq: Every day | ORAL | 3 refills | Status: DC

## 2016-12-06 MED ORDER — GLIPIZIDE 5 MG PO TABS: 5.0000 mg | ORAL_TABLET | Freq: Every day | ORAL | 3 refills | Status: DC

## 2016-12-06 MED ORDER — LISINOPRIL 5 MG PO TABS: 5.0000 mg | ORAL_TABLET | Freq: Every day | ORAL | 3 refills | Status: DC

## 2016-12-06 MED ORDER — ATORVASTATIN CALCIUM 40 MG PO TABS: 40.0000 mg | ORAL_TABLET | Freq: Every day | ORAL | 3 refills | Status: DC

## 2016-12-06 NOTE — Telephone Encounter (Signed)
Ok for all 

## 2016-12-06 NOTE — Telephone Encounter (Signed)
RX sent to [facility].

## 2017-02-19 ENCOUNTER — Ambulatory Visit: Payer: BLUE CROSS/BLUE SHIELD | Admitting: Family Medicine

## 2017-04-10 ENCOUNTER — Telehealth: Payer: Self-pay

## 2017-04-10 NOTE — Telephone Encounter (Signed)
lmtcb to schedule FU for DM as pt is due for A1C.  

## 2017-04-11 NOTE — Telephone Encounter (Signed)
Pt had just started a new job and she is not eligible for insurance for 3 more weeks. She will call back to schedule OV when she has insurance.

## 2017-07-23 ENCOUNTER — Ambulatory Visit (INDEPENDENT_AMBULATORY_CARE_PROVIDER_SITE_OTHER): Payer: PRIVATE HEALTH INSURANCE | Admitting: Family Medicine

## 2017-07-23 VITALS — BP 110/70 | HR 72 | Temp 98.0°F | Resp 16 | Wt 175.0 lb

## 2017-07-23 DIAGNOSIS — E1165 Type 2 diabetes mellitus with hyperglycemia: Secondary | ICD-10-CM

## 2017-07-23 DIAGNOSIS — E118 Type 2 diabetes mellitus with unspecified complications: Secondary | ICD-10-CM | POA: Diagnosis not present

## 2017-07-23 DIAGNOSIS — E782 Mixed hyperlipidemia: Secondary | ICD-10-CM

## 2017-07-23 DIAGNOSIS — I10 Essential (primary) hypertension: Secondary | ICD-10-CM

## 2017-07-23 DIAGNOSIS — IMO0002 Reserved for concepts with insufficient information to code with codable children: Secondary | ICD-10-CM

## 2017-07-23 NOTE — Progress Notes (Signed)
Kelly Manning  MRN: [id number] DOB: January 19, 1956  Subjective:  HPI   The patient is a 61 year old female who presents for follow up of chronic disease.  She was last seen on 11/18/16.  Diabetes-The patient was last checked on 11/18/16.  Her A1C at that time was 9.3.  She was instructed to work on her habits to gain better control and reach goal of her A1C.Marland Kitchen  Hypertension-On her last visit the patient's blood pressure was stable at 122/68.  She was advised to continue with Lisinopril.  Hyperlipidemia-The patient has not had her cholesterol checked since 03/04/2016  The patient had been out of work for some time.  She has recently gone to work at [employer] and had to wait 90 days before her benefits became active.    She is scheduled to get her flu shot at work.  Patient Active Problem List   Diagnosis Date Noted  . Calcium blood increased 02/24/2015  . Hypercholesteremia 02/24/2015  . Microalbuminuria 02/24/2015  . Adiposity 02/24/2015  . HYPERLIPIDEMIA TYPE IIB / III 02/15/2010  . HYPERTENSION, BENIGN 02/15/2010  . CAD, NATIVE VESSEL 02/15/2010  . CAD in native artery 01/24/2010  . Diabetes mellitus type 2 with complications, uncontrolled ([place]) 12/26/2009  . Chest pain 12/26/2009  . Family history of cardiovascular disease 12/26/2009  . Diabetes ([place]) 05/15/2009    Past Medical History:  Diagnosis Date  . CAD (coronary artery disease)    -s/p DES to LAD in May 2011  . DM2 (diabetes mellitus, type 2) ([place])   . Hyperlipidemia   . Hypertension     Social History   Social History  . Marital status: Single    Spouse name: N/A  . Number of children: N/A  . Years of education: N/A   Occupational History  . Not on file.   Social History Main Topics  . Smoking status: Never Smoker  . Smokeless tobacco: Never Used     Comment: tobacco use- no   . Alcohol use No  . Drug use: No  . Sexual activity: Not on file   Other Topics Concern  . Not on file   Social  History Narrative   Regularly exercise. Single. Full time.     Outpatient Encounter Prescriptions as of 07/23/2017  Medication Sig Note  . aspirin 81 MG tablet Take by mouth. 02/24/2015: Received from: [facility]  . atorvastatin (LIPITOR) 40 MG tablet Take 1 tablet (40 mg total) by mouth daily.   . Blood Glucose Monitoring Suppl (ONE TOUCH ULTRA SYSTEM KIT) w/Device KIT 1 kit by Does not apply route 2 (two) times daily. Test strips only. Dx: E11.8   . canagliflozin (INVOKANA) 300 MG TABS tablet Take 1 tablet (300 mg total) by mouth daily.   Marland Kitchen glipiZIDE (GLUCOTROL) 5 MG tablet Take 1 tablet (5 mg total) by mouth daily.   Marland Kitchen lisinopril (PRINIVIL,ZESTRIL) 5 MG tablet Take 1 tablet (5 mg total) by mouth daily.   . ONE TOUCH ULTRA TEST test strip 1 each by Other route 2 (two) times daily. for testing   . sitaGLIPtin-metformin (JANUMET) 50-1000 MG tablet Take 1 tablet by mouth 2 (two) times daily.    No facility-administered encounter medications on file as of 07/23/2017.     Allergies  Allergen Reactions  . Clopidogrel Bisulfate     REACTION: rash    Review of Systems  Constitutional: Negative for fever and malaise/fatigue.  Eyes: Negative.   Respiratory: Negative for cough, shortness  of breath and wheezing.   Cardiovascular: Negative for chest pain, palpitations, orthopnea, claudication and leg swelling.  Gastrointestinal: Negative.   Genitourinary: Negative for frequency.  Skin: Negative.   Neurological: Negative.  Negative for weakness.  Endo/Heme/Allergies: Negative for polydipsia.  Psychiatric/Behavioral: Negative.     Objective:  BP 110/70 (BP Location: Right Arm, Patient Position: Sitting, Cuff Size: Normal)   Pulse 72   Temp 98 F (36.7 C) (Oral)   Resp 16   Wt 175 lb (79.4 kg)   BMI 27.82 kg/m   Physical Exam  Constitutional: She is oriented to person, place, and time and well-developed, well-nourished, and in no distress.  HENT:  Head:  Normocephalic and atraumatic.  Eyes: Conjunctivae are normal. No scleral icterus.  Neck: No thyromegaly present.  Cardiovascular: Normal rate, regular rhythm and normal heart sounds.  Pulmonary/Chest: Effort normal and breath sounds normal.  Abdominal: Soft.  Neurological: She is alert and oriented to person, place, and time. Gait normal. GCS score is 15.  Skin: Skin is warm and dry.  Psychiatric: Mood, memory, affect and judgment normal.    Assessment and Plan :  TIIDM HTN HLD Check labs.  I have done the exam and reviewed the chart and it is accurate to the best of my knowledge. Development worker, community has been used and  any errors in dictation or transcription are unintentional. Miguel Aschoff M.D. [facility]

## 2017-07-24 LAB — LIPID PANEL
CHOL/HDL RATIO: 3.4 (calc) (ref <5.0)
CHOLESTEROL: 279 mg/dL — AB (ref <200)
HDL: 81 mg/dL (ref >50)
LDL Cholesterol (Calc): 178 mg/dL (calc) — ABNORMAL HIGH
NON-HDL CHOLESTEROL (CALC): 198 mg/dL — AB (ref <130)
TRIGLYCERIDES: 90 mg/dL (ref <150)

## 2017-07-24 LAB — COMPLETE METABOLIC PANEL WITH GFR
AG Ratio: 1.4 (calc) (ref 1.0–2.5)
ALT: 13 U/L (ref 6–29)
AST: 16 U/L (ref 10–35)
Albumin: 4.9 g/dL (ref 3.6–5.1)
Alkaline phosphatase (APISO): 111 U/L (ref 33–130)
BILIRUBIN TOTAL: 0.5 mg/dL (ref 0.2–1.2)
BUN/Creatinine Ratio: 31 (calc) — ABNORMAL HIGH (ref 6–22)
BUN: 36 mg/dL — AB (ref 7–25)
CHLORIDE: 101 mmol/L (ref 98–110)
CO2: 20 mmol/L (ref 20–32)
CREATININE: 1.15 mg/dL — AB (ref 0.50–1.05)
Calcium: 10.1 mg/dL (ref 8.6–10.4)
GFR, Est African American: 60 mL/min/{1.73_m2} (ref >=60)
GFR, Est Non African American: 52 mL/min/{1.73_m2} — ABNORMAL LOW (ref >=60)
GLUCOSE: 123 mg/dL — AB (ref 65–99)
Globulin: 3.5 g/dL (calc) (ref 1.9–3.7)
Potassium: 4.5 mmol/L (ref 3.5–5.3)
Sodium: 136 mmol/L (ref 135–146)
Total Protein: 8.4 g/dL — ABNORMAL HIGH (ref 6.1–8.1)

## 2017-07-24 LAB — CBC WITH DIFFERENTIAL/PLATELET
BASOS PCT: 0.4 %
Basophils Absolute: 31 cells/uL (ref 0–200)
EOS ABS: 69 {cells}/uL (ref 15–500)
EOS PCT: 0.9 %
HEMATOCRIT: 41.3 % (ref 35.0–45.0)
HEMOGLOBIN: 14.4 g/dL (ref 11.7–15.5)
LYMPHS ABS: 2094 {cells}/uL (ref 850–3900)
MCH: 30.1 pg (ref 27.0–33.0)
MCHC: 34.9 g/dL (ref 32.0–36.0)
MCV: 86.4 fL (ref 80.0–100.0)
MPV: 12.9 fL — AB (ref 7.5–12.5)
Monocytes Relative: 5.1 %
NEUTROS ABS: 5113 {cells}/uL (ref 1500–7800)
Neutrophils Relative %: 66.4 %
Platelets: 341 10*3/uL (ref 140–400)
RBC: 4.78 10*6/uL (ref 3.80–5.10)
RDW: 12.4 % (ref 11.0–15.0)
Total Lymphocyte: 27.2 %
WBC mixed population: 393 cells/uL (ref 200–950)
WBC: 7.7 10*3/uL (ref 3.8–10.8)

## 2017-07-24 LAB — TSH: TSH: 1.25 m[IU]/L (ref 0.40–4.50)

## 2017-07-24 LAB — HEMOGLOBIN A1C
HEMOGLOBIN A1C: 9.6 %{Hb} — AB (ref <5.7)
Mean Plasma Glucose: 229 (calc)
eAG (mmol/L): 12.7 (calc)

## 2017-07-24 MED ORDER — GLIPIZIDE 5 MG PO TABS: 5.0000 mg | ORAL_TABLET | Freq: Every day | ORAL | 3 refills | Status: DC

## 2017-07-24 MED ORDER — LISINOPRIL 5 MG PO TABS: 5.0000 mg | ORAL_TABLET | Freq: Every day | ORAL | 3 refills | Status: DC

## 2017-07-24 MED ORDER — ATORVASTATIN CALCIUM 40 MG PO TABS: 40.0000 mg | ORAL_TABLET | Freq: Every day | ORAL | 3 refills | Status: DC

## 2017-07-24 MED ORDER — SITAGLIPTIN PHOS-METFORMIN HCL 50-1000 MG PO TABS: 1.0000 | ORAL_TABLET | Freq: Two times a day (BID) | ORAL | 3 refills | Status: DC

## 2017-08-01 ENCOUNTER — Other Ambulatory Visit: Payer: Self-pay

## 2017-08-01 DIAGNOSIS — IMO0002 Reserved for concepts with insufficient information to code with codable children: Secondary | ICD-10-CM

## 2017-08-01 DIAGNOSIS — E118 Type 2 diabetes mellitus with unspecified complications: Principal | ICD-10-CM

## 2017-08-01 DIAGNOSIS — E1165 Type 2 diabetes mellitus with hyperglycemia: Secondary | ICD-10-CM

## 2017-08-01 MED ORDER — GLIPIZIDE 10 MG PO TABS: 10.0000 mg | ORAL_TABLET | Freq: Every day | ORAL | 3 refills | Status: DC

## 2017-08-01 NOTE — Telephone Encounter (Signed)
Patient states she was supposed to increase Glipizide to 10 mg but RX was still at 5 mg. I apologized to the patient and advised her to take 2 tablets of 5 mg and resent RX for 10 mg for when patient needs this-Gerardo Territo Ander PurpuraV Jalina Blowers, RMA

## 2017-10-29 ENCOUNTER — Ambulatory Visit: Payer: PRIVATE HEALTH INSURANCE | Admitting: Family Medicine

## 2017-12-30 ENCOUNTER — Encounter: Payer: Self-pay | Admitting: Family Medicine

## 2017-12-30 ENCOUNTER — Ambulatory Visit (INDEPENDENT_AMBULATORY_CARE_PROVIDER_SITE_OTHER): Payer: Self-pay | Admitting: Family Medicine

## 2017-12-30 VITALS — BP 108/62 | HR 95 | Temp 99.3°F | Resp 18 | Wt 183.0 lb

## 2017-12-30 DIAGNOSIS — E118 Type 2 diabetes mellitus with unspecified complications: Secondary | ICD-10-CM

## 2017-12-30 DIAGNOSIS — I10 Essential (primary) hypertension: Secondary | ICD-10-CM

## 2017-12-30 DIAGNOSIS — E782 Mixed hyperlipidemia: Secondary | ICD-10-CM

## 2017-12-30 DIAGNOSIS — E1165 Type 2 diabetes mellitus with hyperglycemia: Secondary | ICD-10-CM

## 2017-12-30 DIAGNOSIS — IMO0002 Reserved for concepts with insufficient information to code with codable children: Secondary | ICD-10-CM

## 2017-12-30 LAB — POCT GLYCOSYLATED HEMOGLOBIN (HGB A1C): Hemoglobin A1C: 11.9

## 2017-12-30 MED ORDER — GLIPIZIDE 10 MG PO TABS: 10.0000 mg | ORAL_TABLET | Freq: Every day | ORAL | 3 refills | Status: DC

## 2017-12-30 MED ORDER — LISINOPRIL 5 MG PO TABS: 5.0000 mg | ORAL_TABLET | Freq: Every day | ORAL | 3 refills | Status: DC

## 2017-12-30 MED ORDER — METFORMIN HCL 1000 MG PO TABS: 1000.0000 mg | ORAL_TABLET | Freq: Two times a day (BID) | ORAL | 3 refills | Status: DC

## 2017-12-30 MED ORDER — PIOGLITAZONE HCL 15 MG PO TABS: 15.0000 mg | ORAL_TABLET | Freq: Every day | ORAL | 3 refills | Status: DC

## 2017-12-30 MED ORDER — ATORVASTATIN CALCIUM 40 MG PO TABS: 40.0000 mg | ORAL_TABLET | Freq: Every day | ORAL | 3 refills | Status: DC

## 2017-12-30 NOTE — Progress Notes (Signed)
Patient: Kelly Manning Female    DOB: 1956/04/05   62 y.o.   MRN: [id number] Visit Date: 12/30/2017  Today's Provider: Wilhemena Durie, MD   Chief Complaint  Patient presents with  . Diabetes  . Hypertension  . Hyperlipidemia   Subjective:    HPI  Diabetes Mellitus Type II, Follow-up:   Lab Results  Component Value Date   HGBA1C 9.6 (H) 07/23/2017   HGBA1C 9.3 11/18/2016   HGBA1C 8.2 07/08/2016    Last seen for diabetes 6 months ago.  Management since then includes none. She reports good compliance with treatment. She is not having side effects.  Home blood sugar records: 200's fasting  Episodes of hypoglycemia? no   Current Insulin Regimen: n/a Current exercise: walking  Pertinent Labs:    Component Value Date/Time   CHOL 279 (H) 07/23/2017 1626   CHOL 144 03/04/2016 0904   TRIG 90 07/23/2017 1626   HDL 81 07/23/2017 1626   HDL 66 03/04/2016 0904   LDLCALC 178 (H) 07/23/2017 1626   CREATININE 1.15 (H) 07/23/2017 1626    Wt Readings from Last 3 Encounters:  12/30/17 183 lb (83 kg)  07/23/17 175 lb (79.4 kg)  11/18/16 190 lb (86.2 kg)    ------------------------------------------------------------------------    Pt has lost her job and is waiting on her insurance to kick in. She has 2 weeks left of her medication. She will need another month of medication to get her by until her insurance takes effect. She started having cold symptoms about 4 days ago. She has chest congestion, productive cough with green/white sputum. She had chills and fever when it first started but has not had any since.      Allergies  Allergen Reactions  . Clopidogrel Bisulfate     REACTION: rash     Current Outpatient Medications:  .  aspirin 81 MG tablet, Take by mouth., Disp: , Rfl:  .  atorvastatin (LIPITOR) 40 MG tablet, Take 1 tablet (40 mg total) by mouth daily., Disp: 90 tablet, Rfl: 3 .  Blood Glucose Monitoring Suppl (ONE TOUCH ULTRA SYSTEM KIT)  w/Device KIT, 1 kit by Does not apply route 2 (two) times daily. Test strips only. Dx: E11.8, Disp: 100 each, Rfl: 12 .  glipiZIDE (GLUCOTROL) 10 MG tablet, Take 1 tablet (10 mg total) daily by mouth., Disp: 90 tablet, Rfl: 3 .  lisinopril (PRINIVIL,ZESTRIL) 5 MG tablet, Take 1 tablet (5 mg total) by mouth daily., Disp: 90 tablet, Rfl: 3 .  ONE TOUCH ULTRA TEST test strip, 1 each by Other route 2 (two) times daily. for testing, Disp: 100 each, Rfl: 12 .  sitaGLIPtin-metformin (JANUMET) 50-1000 MG tablet, Take 1 tablet by mouth 2 (two) times daily., Disp: 180 tablet, Rfl: 3  Review of Systems  Constitutional: Positive for fatigue.  HENT: Positive for congestion, rhinorrhea, sneezing and sore throat.   Eyes: Negative.   Respiratory: Positive for cough.   Cardiovascular: Negative.   Gastrointestinal: Negative.   Endocrine: Negative.   Genitourinary: Negative.   Musculoskeletal: Negative.   Skin: Negative.   Allergic/Immunologic: Negative.   Neurological: Negative.   Hematological: Negative.   Psychiatric/Behavioral: Negative.     Social History   Tobacco Use  . Smoking status: Never Smoker  . Smokeless tobacco: Never Used  . Tobacco comment: tobacco use- no   Substance Use Topics  . Alcohol use: No   Objective:   BP 108/62 (BP Location: Left Arm, Patient Position: Sitting,  Cuff Size: Normal)   Pulse 95   Temp 99.3 F (37.4 C) (Oral)   Resp 18   Wt 183 lb (83 kg)   SpO2 98%   BMI 29.09 kg/m  Vitals:   12/30/17 1424  BP: 108/62  Pulse: 95  Resp: 18  Temp: 99.3 F (37.4 C)  TempSrc: Oral  SpO2: 98%  Weight: 183 lb (83 kg)     Physical Exam  Constitutional: She is oriented to person, place, and time. She appears well-developed and well-nourished.  HENT:  Head: Normocephalic.  Eyes: No scleral icterus.  Neck: No thyromegaly present.  Cardiovascular: Normal rate, regular rhythm and normal heart sounds.  Pulmonary/Chest: Effort normal and breath sounds normal.    Musculoskeletal: She exhibits no edema.  Lymphadenopathy:    She has no cervical adenopathy.  Neurological: She is alert and oriented to person, place, and time.  Skin: Skin is warm and dry.  Psychiatric: She has a normal mood and affect. Her behavior is normal. Judgment and thought content normal.        Assessment & Plan:     1. Diabetes mellitus type 2 with complications, uncontrolled ([place]) Pt starting new job but without insurance for now .[place] too expensive. Add Actos 15 mg q am. RTC 3 months., - POCT HgB A1C--11.9 - metFORMIN (GLUCOPHAGE) 1000 MG tablet; Take 1 tablet (1,000 mg total) by mouth 2 (two) times daily with a meal.  Dispense: 180 tablet; Refill: 3 - pioglitazone (ACTOS) 15 MG tablet; Take 1 tablet (15 mg total) by mouth daily.  Dispense: 90 tablet; Refill: 3 - glipiZIDE (GLUCOTROL) 10 MG tablet; Take 1 tablet (10 mg total) by mouth daily.  Dispense: 90 tablet; Refill: 3  2. HYPERTENSION, BENIGN  - lisinopril (PRINIVIL,ZESTRIL) 5 MG tablet; Take 1 tablet (5 mg total) by mouth daily.  Dispense: 90 tablet; Refill: 3  3. HYPERLIPIDEMIA TYPE IIB / III  - atorvastatin (LIPITOR) 40 MG tablet; Take 1 tablet (40 mg total) by mouth daily.  Dispense: 90 tablet; Refill: 3      I have done the exam and reviewed the above chart and it is accurate to the best of my knowledge. Development worker, community has been used in this note in any air is in the dictation or transcription are unintentional.  Wilhemena Durie, MD  [place]

## 2018-03-31 ENCOUNTER — Ambulatory Visit: Payer: Self-pay | Admitting: Family Medicine

## 2018-04-14 ENCOUNTER — Ambulatory Visit (INDEPENDENT_AMBULATORY_CARE_PROVIDER_SITE_OTHER): Payer: BLUE CROSS/BLUE SHIELD | Admitting: Family Medicine

## 2018-04-14 VITALS — BP 102/67 | HR 80 | Temp 98.4°F | Resp 16 | Wt 185.0 lb

## 2018-04-14 DIAGNOSIS — E7800 Pure hypercholesterolemia, unspecified: Secondary | ICD-10-CM | POA: Diagnosis not present

## 2018-04-14 DIAGNOSIS — E1165 Type 2 diabetes mellitus with hyperglycemia: Secondary | ICD-10-CM | POA: Diagnosis not present

## 2018-04-14 DIAGNOSIS — E118 Type 2 diabetes mellitus with unspecified complications: Secondary | ICD-10-CM | POA: Diagnosis not present

## 2018-04-14 DIAGNOSIS — IMO0002 Reserved for concepts with insufficient information to code with codable children: Secondary | ICD-10-CM

## 2018-04-14 DIAGNOSIS — Z6830 Body mass index (BMI) 30.0-30.9, adult: Secondary | ICD-10-CM | POA: Diagnosis not present

## 2018-04-14 LAB — POCT GLYCOSYLATED HEMOGLOBIN (HGB A1C): HEMOGLOBIN A1C: 11.3 % — AB (ref 4.0–5.6)

## 2018-04-14 LAB — POCT UA - MICROALBUMIN: Microalbumin Ur, POC: 50 mg/L

## 2018-04-14 MED ORDER — SITAGLIPTIN PHOSPHATE 100 MG PO TABS
100.0000 mg | ORAL_TABLET | Freq: Every day | ORAL | 5 refills | Status: DC
Start: 2018-04-14 — End: 2019-03-02

## 2018-04-14 NOTE — Progress Notes (Signed)
Kelly Manning  MRN: [id number] DOB: Mar 25, 1956  Subjective:  HPI   The patient is a 62 year old female who presents for follow up of diabetes.  She was last seen in April.  Her A1C at that time was  11.9. The patient has been checking her glucose and getting readings that range from 125-200's.   The patient did not have insurance so she had the Janumet changed to Actos and Metformin on the last vist. She is not back on insurance and would like to go back to the Echo as she felt it worked better for her. The patient is due for her diabetic foot exam and a urine micro-albumin.    Patient Active Problem List   Diagnosis Date Noted  . Calcium blood increased 02/24/2015  . Hypercholesteremia 02/24/2015  . Microalbuminuria 02/24/2015  . Adiposity 02/24/2015  . HYPERLIPIDEMIA TYPE IIB / III 02/15/2010  . HYPERTENSION, BENIGN 02/15/2010  . CAD, NATIVE VESSEL 02/15/2010  . CAD in native artery 01/24/2010  . Diabetes mellitus type 2 with complications, uncontrolled ([place]) 12/26/2009  . Chest pain 12/26/2009  . Family history of cardiovascular disease 12/26/2009  . Diabetes ([place]) 05/15/2009    Past Medical History:  Diagnosis Date  . CAD (coronary artery disease)    -s/p DES to LAD in May 2011  . DM2 (diabetes mellitus, type 2) ([place])   . Hyperlipidemia   . Hypertension     Social History   Socioeconomic History  . Marital status: Single    Spouse name: Not on file  . Number of children: Not on file  . Years of education: Not on file  . Highest education level: Not on file  Occupational History  . Not on file  Social Needs  . Financial resource strain: Not on file  . Food insecurity:    Worry: Not on file    Inability: Not on file  . Transportation needs:    Medical: Not on file    Non-medical: Not on file  Tobacco Use  . Smoking status: Never Smoker  . Smokeless tobacco: Never Used  . Tobacco comment: tobacco use- no   Substance and Sexual Activity  . Alcohol  use: No  . Drug use: No  . Sexual activity: Not on file  Lifestyle  . Physical activity:    Days per week: Not on file    Minutes per session: Not on file  . Stress: Not on file  Relationships  . Social connections:    Talks on phone: Not on file    Gets together: Not on file    Attends religious service: Not on file    Active member of club or organization: Not on file    Attends meetings of clubs or organizations: Not on file    Relationship status: Not on file  . Intimate partner violence:    Fear of current or ex partner: Not on file    Emotionally abused: Not on file    Physically abused: Not on file    Forced sexual activity: Not on file  Other Topics Concern  . Not on file  Social History Narrative   Regularly exercise. Single. Full time.     Outpatient Encounter Medications as of 04/14/2018  Medication Sig Note  . aspirin 81 MG tablet Take by mouth. 02/24/2015: Received from: [facility]  . atorvastatin (LIPITOR) 40 MG tablet Take 1 tablet (40 mg total) by mouth daily.   . Blood Glucose Monitoring Suppl (  ONE TOUCH ULTRA SYSTEM KIT) w/Device KIT 1 kit by Does not apply route 2 (two) times daily. Test strips only. Dx: E11.8   . glipiZIDE (GLUCOTROL) 10 MG tablet Take 1 tablet (10 mg total) by mouth daily.   Marland Kitchen lisinopril (PRINIVIL,ZESTRIL) 5 MG tablet Take 1 tablet (5 mg total) by mouth daily.   . metFORMIN (GLUCOPHAGE) 1000 MG tablet Take 1 tablet (1,000 mg total) by mouth 2 (two) times daily with a meal.   . ONE TOUCH ULTRA TEST test strip 1 each by Other route 2 (two) times daily. for testing   . pioglitazone (ACTOS) 15 MG tablet Take 1 tablet (15 mg total) by mouth daily.   . sitaGLIPtin-metformin (JANUMET) 50-1000 MG tablet Take 1 tablet by mouth 2 (two) times daily. (Patient not taking: Reported on 04/14/2018)    No facility-administered encounter medications on file as of 04/14/2018.     Allergies  Allergen Reactions  . Clopidogrel Bisulfate      REACTION: rash    Review of Systems  Constitutional: Negative for fever and malaise/fatigue.  Eyes: Negative.   Respiratory: Negative for cough, shortness of breath and wheezing.   Cardiovascular: Negative for chest pain, palpitations, orthopnea, claudication and leg swelling.  Gastrointestinal: Negative.   Genitourinary: Negative for frequency.  Neurological: Negative for dizziness and headaches.  Endo/Heme/Allergies: Negative for polydipsia.  Psychiatric/Behavioral: Negative.     Objective:  BP 102/67 (BP Location: Right Arm, Patient Position: Sitting, Cuff Size: Normal)   Pulse 80   Temp 98.4 F (36.9 C) (Oral)   Resp 16   Wt 185 lb (83.9 kg)   BMI 29.41 kg/m   Physical Exam  Constitutional: She is oriented to person, place, and time and well-developed, well-nourished, and in no distress.  HENT:  Head: Normocephalic and atraumatic.  Right Ear: External ear normal.  Left Ear: External ear normal.  Nose: Nose normal.  Eyes: Conjunctivae are normal. No scleral icterus.  Neck: No thyromegaly present.  Cardiovascular: Normal rate, regular rhythm and normal heart sounds.  Pulmonary/Chest: Effort normal and breath sounds normal.  Abdominal: Soft.  Musculoskeletal: She exhibits no edema.  Neurological: She is alert and oriented to person, place, and time. Gait normal. GCS score is 15.  Skin: Skin is warm and dry.  Psychiatric: Mood, memory, affect and judgment normal.    Assessment and Plan :  1. Diabetes mellitus type 2 with complications, uncontrolled (HCC) Add januvia 144m daily. I do not think that this will be enough. Will address on next visit with f/u A1C. - POCT glycosylated hemoglobin (Hb A1C)--11.3 - POCT UA - Microalbumin - CBC with Differential/Platelet - sitaGLIPtin (JANUVIA) 100 MG tablet; Take 1 tablet (100 mg total) by mouth daily.  Dispense: 30 tablet; Refill: 5  2. Hypercholesteremia  - Comprehensive metabolic panel - Lipid panel - TSH  3. BMI  30.0-30.9,adult  - TSH 4.HTN  I have done the exam and reviewed the above chart and it is accurate to the best of my knowledge. DDevelopment worker, communityhas been used in this note in any air is in the dictation or transcription are unintentional. I have done the exam and reviewed the above chart and it is accurate to the best of my knowledge. DDevelopment worker, communityhas been used in this note in any air is in the dictation or transcription are unintentional. I have done the exam and reviewed the chart and it is accurate to the best of my knowledge. DDevelopment worker, communityhas been used  and  any errors in dictation or transcription are unintentional. Richard Gilbert M.D. [facility]  

## 2018-04-15 LAB — CBC WITH DIFFERENTIAL/PLATELET
BASOS ABS: 0 10*3/uL (ref 0.0–0.2)
Basos: 0 %
EOS (ABSOLUTE): 0.1 10*3/uL (ref 0.0–0.4)
EOS: 2 %
HEMATOCRIT: 38.8 % (ref 34.0–46.6)
HEMOGLOBIN: 13 g/dL (ref 11.1–15.9)
IMMATURE GRANULOCYTES: 0 %
Immature Grans (Abs): 0 10*3/uL (ref 0.0–0.1)
LYMPHS ABS: 2.2 10*3/uL (ref 0.7–3.1)
LYMPHS: 31 %
MCH: 29.7 pg (ref 26.6–33.0)
MCHC: 33.5 g/dL (ref 31.5–35.7)
MCV: 89 fL (ref 79–97)
MONOCYTES: 4 %
Monocytes Absolute: 0.3 10*3/uL (ref 0.1–0.9)
NEUTROS PCT: 63 %
Neutrophils Absolute: 4.4 10*3/uL (ref 1.4–7.0)
Platelets: 287 10*3/uL (ref 150–450)
RBC: 4.37 x10E6/uL (ref 3.77–5.28)
RDW: 13.3 % (ref 12.3–15.4)
WBC: 7.1 10*3/uL (ref 3.4–10.8)

## 2018-04-15 LAB — LIPID PANEL
CHOL/HDL RATIO: 2.5 ratio (ref 0.0–4.4)
Cholesterol, Total: 143 mg/dL (ref 100–199)
HDL: 58 mg/dL (ref >39)
LDL CALC: 64 mg/dL (ref 0–99)
Triglycerides: 105 mg/dL (ref 0–149)
VLDL CHOLESTEROL CAL: 21 mg/dL (ref 5–40)

## 2018-04-15 LAB — COMPREHENSIVE METABOLIC PANEL
ALBUMIN: 4.6 g/dL (ref 3.6–4.8)
ALK PHOS: 102 IU/L (ref 39–117)
ALT: 18 IU/L (ref 0–32)
AST: 20 IU/L (ref 0–40)
Albumin/Globulin Ratio: 1.6 (ref 1.2–2.2)
BUN/Creatinine Ratio: 25 (ref 12–28)
BUN: 34 mg/dL — AB (ref 8–27)
Bilirubin Total: <0.2 mg/dL (ref 0.0–1.2)
CALCIUM: 9.3 mg/dL (ref 8.7–10.3)
CO2: 20 mmol/L (ref 20–29)
CREATININE: 1.38 mg/dL — AB (ref 0.57–1.00)
Chloride: 104 mmol/L (ref 96–106)
GFR calc Af Amer: 48 mL/min/{1.73_m2} — ABNORMAL LOW (ref >59)
GFR, EST NON AFRICAN AMERICAN: 42 mL/min/{1.73_m2} — AB (ref >59)
GLUCOSE: 167 mg/dL — AB (ref 65–99)
Globulin, Total: 2.9 g/dL (ref 1.5–4.5)
Potassium: 5.2 mmol/L (ref 3.5–5.2)
Sodium: 141 mmol/L (ref 134–144)
Total Protein: 7.5 g/dL (ref 6.0–8.5)

## 2018-04-15 LAB — TSH: TSH: 0.898 u[IU]/mL (ref 0.450–4.500)

## 2018-08-19 ENCOUNTER — Ambulatory Visit: Payer: Self-pay | Admitting: Family Medicine

## 2019-01-11 ENCOUNTER — Other Ambulatory Visit: Payer: Self-pay | Admitting: Family Medicine

## 2019-01-11 DIAGNOSIS — E782 Mixed hyperlipidemia: Secondary | ICD-10-CM

## 2019-01-11 DIAGNOSIS — E1165 Type 2 diabetes mellitus with hyperglycemia: Secondary | ICD-10-CM

## 2019-01-11 DIAGNOSIS — IMO0002 Reserved for concepts with insufficient information to code with codable children: Secondary | ICD-10-CM

## 2019-01-11 DIAGNOSIS — E118 Type 2 diabetes mellitus with unspecified complications: Secondary | ICD-10-CM

## 2019-01-11 DIAGNOSIS — I10 Essential (primary) hypertension: Secondary | ICD-10-CM

## 2019-01-14 ENCOUNTER — Telehealth: Payer: Self-pay

## 2019-01-14 NOTE — Telephone Encounter (Signed)
Called patient from recall list.  No answer. LMOV.  This is the 3rd attempt.  Recall will be deleted.

## 2019-03-02 ENCOUNTER — Other Ambulatory Visit: Payer: Self-pay

## 2019-03-02 ENCOUNTER — Encounter: Payer: Self-pay | Admitting: Family Medicine

## 2019-03-02 ENCOUNTER — Ambulatory Visit: Payer: BLUE CROSS/BLUE SHIELD | Admitting: Family Medicine

## 2019-03-02 VITALS — BP 104/70 | HR 86 | Temp 98.5°F | Resp 16 | Wt 192.2 lb

## 2019-03-02 DIAGNOSIS — E7800 Pure hypercholesterolemia, unspecified: Secondary | ICD-10-CM

## 2019-03-02 DIAGNOSIS — IMO0002 Reserved for concepts with insufficient information to code with codable children: Secondary | ICD-10-CM

## 2019-03-02 DIAGNOSIS — E782 Mixed hyperlipidemia: Secondary | ICD-10-CM

## 2019-03-02 DIAGNOSIS — E1165 Type 2 diabetes mellitus with hyperglycemia: Secondary | ICD-10-CM

## 2019-03-02 DIAGNOSIS — I10 Essential (primary) hypertension: Secondary | ICD-10-CM | POA: Diagnosis not present

## 2019-03-02 DIAGNOSIS — E118 Type 2 diabetes mellitus with unspecified complications: Secondary | ICD-10-CM | POA: Diagnosis not present

## 2019-03-02 LAB — POCT GLYCOSYLATED HEMOGLOBIN (HGB A1C)

## 2019-03-02 LAB — POCT UA - MICROALBUMIN: Microalbumin Ur, POC: 100 mg/L

## 2019-03-02 MED ORDER — ATORVASTATIN CALCIUM 40 MG PO TABS: 40.0000 mg | ORAL_TABLET | Freq: Every day | ORAL | 3 refills | Status: DC

## 2019-03-02 MED ORDER — METFORMIN HCL 1000 MG PO TABS: 1000.0000 mg | ORAL_TABLET | Freq: Two times a day (BID) | ORAL | 3 refills | Status: DC

## 2019-03-02 MED ORDER — GLIPIZIDE 10 MG PO TABS: 10.0000 mg | ORAL_TABLET | Freq: Every day | ORAL | 3 refills | Status: DC

## 2019-03-02 MED ORDER — LISINOPRIL 5 MG PO TABS: 5.0000 mg | ORAL_TABLET | Freq: Every day | ORAL | 3 refills | Status: DC

## 2019-03-02 MED ORDER — SITAGLIPTIN PHOSPHATE 100 MG PO TABS: 100.0000 mg | ORAL_TABLET | Freq: Every day | ORAL | 3 refills | Status: DC

## 2019-03-02 NOTE — Progress Notes (Signed)
Patient: Kelly Manning Female    DOB: [birth year]   63 y.o.   MRN: [id number] Visit Date: 03/02/2019  Today's Provider: Wilhemena Durie, MD   Chief Complaint  Patient presents with  . Diabetes  . Hypertension  . Hyperlipidemia   Subjective:     HPI  Diabetes Mellitus Type II, Follow-up:   Lab Results  Component Value Date   HGBA1C 11.3 (A) 04/14/2018   HGBA1C 11.9 12/30/2017   HGBA1C 9.6 (H) 07/23/2017   Last seen for diabetes 11 months ago.  Management since then includes adding Januvia 14m. She reports excellent compliance with treatment. She is not having side effects.  Current symptoms include hyperglycemia and hypoglycemia  and have been unchanged. Home blood sugar records: 200s  Episodes of hypoglycemia? yes -    Current Insulin Regimen: n/a Most Recent Eye Exam: 07/29/2016 Weight trend: stable Current diet: in general, an "unhealthy" diet Current exercise: yard work  ------------------------------------------------------------------------   Hypertension, follow-up:  BP Readings from Last 3 Encounters:  03/02/19 104/70  04/14/18 102/67  12/30/17 108/62    She was last seen for hypertension 11 months ago.  BP at that visit was 102/67. Management since that visit includes NONE.She reports excellent compliance with treatment. She is not having side effects.  She is exercising. She is adherent to low salt diet.   Outside blood pressures are systolic range 1494-496 diastolic 775-91M She is experiencing none.  Patient denies chest pain, chest pressure/discomfort, dyspnea, exertional chest pressure/discomfort, fatigue, irregular heart beat, lower extremity edema, near-syncope, orthopnea, palpitations, paroxysmal nocturnal dyspnea, syncope and tachypnea.   Cardiovascular risk factors include advanced age (older than 542for men, 643for women), diabetes mellitus and hypertension.  Use of agents associated with hypertension: NSAIDS.    ------------------------------------------------------------------------    Lipid/Cholesterol, Follow-up:   Last seen for this 11 months ago.  Management since that visit includes none.  Last Lipid Panel:    Component Value Date/Time   CHOL 143 04/14/2018 1131   TRIG 105 04/14/2018 1131   HDL 58 04/14/2018 1131   CHOLHDL 2.5 04/14/2018 1131   CHOLHDL 3.4 07/23/2017 1626   LDLCALC 64 04/14/2018 1131   LDLCALC 178 (H) 07/23/2017 1626    She reports excellent compliance with treatment. She is not having side effects.   Wt Readings from Last 3 Encounters:  03/02/19 192 lb 3.2 oz (87.2 kg)  04/14/18 185 lb (83.9 kg)  12/30/17 183 lb (83 kg)   No hypoglycemia. Sister died last week with MI at age 62  FH--mother with ovarian cancer. Brother with lung cancer. ------------------------------------------------------------------------  Allergies  Allergen Reactions  . Clopidogrel Bisulfate     REACTION: rash     Current Outpatient Medications:  .  aspirin 81 MG tablet, Take by mouth., Disp: , Rfl:  .  atorvastatin (LIPITOR) 40 MG tablet, Take 1 tablet by mouth once daily, Disp: 30 tablet, Rfl: 0 .  Blood Glucose Monitoring Suppl (ONE TOUCH ULTRA SYSTEM KIT) w/Device KIT, 1 kit by Does not apply route 2 (two) times daily. Test strips only. Dx: E11.8, Disp: 100 each, Rfl: 12 .  glipiZIDE (GLUCOTROL) 10 MG tablet, Take 1 tablet by mouth once daily, Disp: 30 tablet, Rfl: 0 .  lisinopril (ZESTRIL) 5 MG tablet, Take 1 tablet by mouth once daily, Disp: 30 tablet, Rfl: 0 .  metFORMIN (GLUCOPHAGE) 1000 MG tablet, TAKE 1 TABLET BY MOUTH TWICE DAILY WITH MEALS, Disp: 60 tablet, Rfl: 0 .  ONE TOUCH ULTRA TEST test strip, 1 each by Other route 2 (two) times daily. for testing, Disp: 100 each, Rfl: 12 .  pioglitazone (ACTOS) 15 MG tablet, Take 1 tablet by mouth once daily, Disp: 30 tablet, Rfl: 0 .  sitaGLIPtin (JANUVIA) 100 MG tablet, Take 1 tablet (100 mg total) by mouth daily., Disp: 30  tablet, Rfl: 5 .  sitaGLIPtin-metformin (JANUMET) 50-1000 MG tablet, Take 1 tablet by mouth 2 (two) times daily., Disp: 180 tablet, Rfl: 3  Review of Systems  Constitutional: Negative.   HENT: Negative.   Respiratory: Negative.   Cardiovascular: Negative.   Gastrointestinal: Negative.   Endocrine: Negative.   Genitourinary: Negative.   Musculoskeletal: Negative.   Neurological: Negative.     Social History   Tobacco Use  . Smoking status: Never Smoker  . Smokeless tobacco: Never Used  . Tobacco comment: tobacco use- no   Substance Use Topics  . Alcohol use: No      Objective:   BP 104/70   Pulse 86   Temp 98.5 F (36.9 C) (Oral)   Resp 16   Wt 192 lb 3.2 oz (87.2 kg)   SpO2 99%   BMI 30.56 kg/m  Vitals:   03/02/19 0930  BP: 104/70  Pulse: 86  Resp: 16  Temp: 98.5 F (36.9 C)  TempSrc: Oral  SpO2: 99%  Weight: 192 lb 3.2 oz (87.2 kg)     Physical Exam Vitals signs reviewed.  Constitutional:      Appearance: She is well-developed.  HENT:     Head: Normocephalic.  Eyes:     General: No scleral icterus. Neck:     Thyroid: No thyromegaly.  Cardiovascular:     Rate and Rhythm: Normal rate and regular rhythm.     Heart sounds: Normal heart sounds.  Pulmonary:     Effort: Pulmonary effort is normal.     Breath sounds: Normal breath sounds.  Lymphadenopathy:     Cervical: No cervical adenopathy.  Skin:    General: Skin is warm and dry.  Neurological:     Mental Status: She is alert and oriented to person, place, and time. Mental status is at baseline.  Psychiatric:        Behavior: Behavior normal.        Thought Content: Thought content normal.        Judgment: Judgment normal.         Assessment & Plan    1. Diabetes mellitus type 2 with complications, uncontrolled ([place]) Very poor control.Work on habits. Increase Actos dose.Consider Wilder Glade. - POCT glycosylated hemoglobin (Hb A1C)-->14 - POCT UA - Microalbumin - CBC with  Differential/Platelet - sitaGLIPtin (JANUVIA) 100 MG tablet; Take 1 tablet (100 mg total) by mouth daily.  Dispense: 30 tablet; Refill: 3 - metFORMIN (GLUCOPHAGE) 1000 MG tablet; Take 1 tablet (1,000 mg total) by mouth 2 (two) times daily with a meal.  Dispense: 60 tablet; Refill: 3 - glipiZIDE (GLUCOTROL) 10 MG tablet; Take 1 tablet (10 mg total) by mouth daily.  Dispense: 30 tablet; Refill: 3  2. Hypercholesteremia  - Lipid panel - Comprehensive metabolic panel  3. HYPERTENSION, BENIGN  - TSH - Comprehensive metabolic panel - lisinopril (ZESTRIL) 5 MG tablet; Take 1 tablet (5 mg total) by mouth daily.  Dispense: 30 tablet; Refill: 3  4. HYPERLIPIDEMIA TYPE IIB / III On statin - atorvastatin (LIPITOR) 40 MG tablet; Take 1 tablet (40 mg total) by mouth daily.  Dispense: 30 tablet; Refill: 3  Richard Cranford Mon, MD  [facility] Fritzi Mandes Wolford,acting as a scribe for Wilhemena Durie, MD.,have documented all relevant documentation on the behalf of Wilhemena Durie, MD,as directed by  Wilhemena Durie, MD while in the presence of Wilhemena Durie, MD.

## 2019-03-02 NOTE — Patient Instructions (Signed)
Begin taking Rybelsus daily.

## 2019-03-03 LAB — CBC WITH DIFFERENTIAL/PLATELET
Basophils Absolute: 0.1 10*3/uL (ref 0.0–0.2)
Basos: 1 %
EOS (ABSOLUTE): 0.2 10*3/uL (ref 0.0–0.4)
Eos: 2 %
Hematocrit: 35.1 % (ref 34.0–46.6)
Hemoglobin: 12 g/dL (ref 11.1–15.9)
Immature Grans (Abs): 0.1 10*3/uL (ref 0.0–0.1)
Immature Granulocytes: 1 %
Lymphocytes Absolute: 2.2 10*3/uL (ref 0.7–3.1)
Lymphs: 26 %
MCH: 30 pg (ref 26.6–33.0)
MCHC: 34.2 g/dL (ref 31.5–35.7)
MCV: 88 fL (ref 79–97)
Monocytes Absolute: 0.5 10*3/uL (ref 0.1–0.9)
Monocytes: 6 %
Neutrophils Absolute: 5.7 10*3/uL (ref 1.4–7.0)
Neutrophils: 64 %
Platelets: 322 10*3/uL (ref 150–450)
RBC: 4 x10E6/uL (ref 3.77–5.28)
RDW: 11.7 % (ref 11.7–15.4)
WBC: 8.7 10*3/uL (ref 3.4–10.8)

## 2019-03-03 LAB — COMPREHENSIVE METABOLIC PANEL
ALT: 22 IU/L (ref 0–32)
AST: 17 IU/L (ref 0–40)
Albumin/Globulin Ratio: 1.6 (ref 1.2–2.2)
Albumin: 4.4 g/dL (ref 3.8–4.8)
Alkaline Phosphatase: 134 IU/L — ABNORMAL HIGH (ref 39–117)
BUN/Creatinine Ratio: 21 (ref 12–28)
BUN: 35 mg/dL — ABNORMAL HIGH (ref 8–27)
Bilirubin Total: <0.2 mg/dL (ref 0.0–1.2)
CO2: 19 mmol/L — ABNORMAL LOW (ref 20–29)
Calcium: 9 mg/dL (ref 8.7–10.3)
Chloride: 99 mmol/L (ref 96–106)
Creatinine, Ser: 1.68 mg/dL — ABNORMAL HIGH (ref 0.57–1.00)
GFR calc Af Amer: 38 mL/min/{1.73_m2} — ABNORMAL LOW (ref >59)
GFR calc non Af Amer: 33 mL/min/{1.73_m2} — ABNORMAL LOW (ref >59)
Globulin, Total: 2.8 g/dL (ref 1.5–4.5)
Glucose: 304 mg/dL — ABNORMAL HIGH (ref 65–99)
Potassium: 5.6 mmol/L — ABNORMAL HIGH (ref 3.5–5.2)
Sodium: 134 mmol/L (ref 134–144)
Total Protein: 7.2 g/dL (ref 6.0–8.5)

## 2019-03-03 LAB — LIPID PANEL
Chol/HDL Ratio: 3.4 ratio (ref 0.0–4.4)
Cholesterol, Total: 199 mg/dL (ref 100–199)
HDL: 59 mg/dL (ref >39)
LDL Calculated: 106 mg/dL — ABNORMAL HIGH (ref 0–99)
Triglycerides: 170 mg/dL — ABNORMAL HIGH (ref 0–149)
VLDL Cholesterol Cal: 34 mg/dL (ref 5–40)

## 2019-03-03 LAB — TSH: TSH: 1.73 u[IU]/mL (ref 0.450–4.500)

## 2019-03-04 ENCOUNTER — Telehealth: Payer: Self-pay

## 2019-03-04 NOTE — Telephone Encounter (Signed)
Patient notified of recommendations and results.

## 2019-03-04 NOTE — Telephone Encounter (Signed)
LVMTRC 

## 2019-03-04 NOTE — Telephone Encounter (Signed)
-----   Message from Jerrol Banana., MD sent at 03/04/2019  8:13 AM EDT ----- Labs stable but kidney function slightly worse.  Avoid any anti-inflammatories.  Push fluids.

## 2019-04-01 ENCOUNTER — Ambulatory Visit: Payer: Self-pay | Admitting: Family Medicine

## 2019-04-07 ENCOUNTER — Other Ambulatory Visit: Payer: Self-pay

## 2019-04-07 ENCOUNTER — Ambulatory Visit: Payer: BC Managed Care – PPO | Admitting: Family Medicine

## 2019-04-07 ENCOUNTER — Encounter: Payer: Self-pay | Admitting: Family Medicine

## 2019-04-07 VITALS — BP 108/71 | HR 86 | Temp 98.4°F | Resp 16 | Wt 193.8 lb

## 2019-04-07 DIAGNOSIS — I10 Essential (primary) hypertension: Secondary | ICD-10-CM

## 2019-04-07 DIAGNOSIS — E118 Type 2 diabetes mellitus with unspecified complications: Secondary | ICD-10-CM

## 2019-04-07 DIAGNOSIS — E1165 Type 2 diabetes mellitus with hyperglycemia: Secondary | ICD-10-CM

## 2019-04-07 DIAGNOSIS — I2510 Atherosclerotic heart disease of native coronary artery without angina pectoris: Secondary | ICD-10-CM | POA: Diagnosis not present

## 2019-04-07 DIAGNOSIS — E782 Mixed hyperlipidemia: Secondary | ICD-10-CM

## 2019-04-07 DIAGNOSIS — IMO0002 Reserved for concepts with insufficient information to code with codable children: Secondary | ICD-10-CM

## 2019-04-07 NOTE — Progress Notes (Signed)
Patient: Kelly Manning Female    DOB: [birth year]   62 y.o.   MRN: [id number] Visit Date: 04/07/2019  Today's Provider: Wilhemena Durie, MD   Chief Complaint  Patient presents with  . Diabetes   Subjective:     HPI   Diabetes Mellitus Type II, Follow-up:   Lab Results  Component Value Date   HGBA1C  03/02/2019     Comment:     greater than 14.0   HGBA1C 11.3 (A) 04/14/2018   HGBA1C 11.9 12/30/2017    Last seen for diabetes 1 months ago.  Management since then includes increasing Actos and working on improving habits. She reports excellent compliance with treatment. She is not having side effects.  Current symptoms include hyperglycemia, hypoglycemia  and visual disturbances and have been unchanged. Home blood sugar records: trend: fluctuating a bit She says she is feeling well. Episodes of hypoglycemia? yes -    Current insulin regiment: Is not on insulin Most Recent Eye Exam: 07/29/16 Weight trend: stable Prior visit with dietician: No Current exercise: yard work Current diet habits: in general, a "healthy" diet    Pertinent Labs:    Component Value Date/Time   CHOL 199 03/02/2019 1011   TRIG 170 (H) 03/02/2019 1011   HDL 59 03/02/2019 1011   LDLCALC 106 (H) 03/02/2019 1011   LDLCALC 178 (H) 07/23/2017 1626   CREATININE 1.68 (H) 03/02/2019 1011   CREATININE 1.15 (H) 07/23/2017 1626    Wt Readings from Last 3 Encounters:  04/07/19 193 lb 12.8 oz (87.9 kg)  03/02/19 192 lb 3.2 oz (87.2 kg)  04/14/18 185 lb (83.9 kg)    ------------------------------------------------------------------------   Allergies  Allergen Reactions  . Clopidogrel Bisulfate     REACTION: rash     Current Outpatient Medications:  .  aspirin 81 MG tablet, Take by mouth., Disp: , Rfl:  .  atorvastatin (LIPITOR) 40 MG tablet, Take 1 tablet (40 mg total) by mouth daily., Disp: 30 tablet, Rfl: 3 .  Blood Glucose Monitoring Suppl (ONE TOUCH ULTRA SYSTEM KIT) w/Device  KIT, 1 kit by Does not apply route 2 (two) times daily. Test strips only. Dx: E11.8, Disp: 100 each, Rfl: 12 .  glipiZIDE (GLUCOTROL) 10 MG tablet, Take 1 tablet (10 mg total) by mouth daily., Disp: 30 tablet, Rfl: 3 .  lisinopril (ZESTRIL) 5 MG tablet, Take 1 tablet (5 mg total) by mouth daily., Disp: 30 tablet, Rfl: 3 .  metFORMIN (GLUCOPHAGE) 1000 MG tablet, Take 1 tablet (1,000 mg total) by mouth 2 (two) times daily with a meal., Disp: 60 tablet, Rfl: 3 .  ONE TOUCH ULTRA TEST test strip, 1 each by Other route 2 (two) times daily. for testing, Disp: 100 each, Rfl: 12 .  pioglitazone (ACTOS) 15 MG tablet, Take 1 tablet by mouth once daily, Disp: 30 tablet, Rfl: 0 .  sitaGLIPtin (JANUVIA) 100 MG tablet, Take 1 tablet (100 mg total) by mouth daily., Disp: 30 tablet, Rfl: 3 .  sitaGLIPtin-metformin (JANUMET) 50-1000 MG tablet, Take 1 tablet by mouth 2 (two) times daily., Disp: 180 tablet, Rfl: 3  Review of Systems  Constitutional: Negative.   HENT: Negative.   Eyes: Positive for visual disturbance.  Respiratory: Negative.   Cardiovascular: Negative.   Gastrointestinal: Negative.   Endocrine: Negative.   Genitourinary: Negative.   Musculoskeletal: Negative.   Allergic/Immunologic: Negative.   Psychiatric/Behavioral: Negative.     Social History   Tobacco Use  . Smoking status:  Never Smoker  . Smokeless tobacco: Never Used  . Tobacco comment: tobacco use- no   Substance Use Topics  . Alcohol use: No      Objective:   BP 108/71   Pulse 86   Temp 98.4 F (36.9 C) (Oral)   Resp 16   Wt 193 lb 12.8 oz (87.9 kg)   BMI 30.81 kg/m  Vitals:   04/07/19 1053  BP: 108/71  Pulse: 86  Resp: 16  Temp: 98.4 F (36.9 C)  TempSrc: Oral  Weight: 193 lb 12.8 oz (87.9 kg)     Physical Exam Vitals signs reviewed.  Constitutional:      Appearance: She is well-developed.  HENT:     Head: Normocephalic.  Eyes:     General: No scleral icterus. Neck:     Thyroid: No thyromegaly.   Cardiovascular:     Rate and Rhythm: Normal rate and regular rhythm.     Heart sounds: Normal heart sounds.  Pulmonary:     Effort: Pulmonary effort is normal.     Breath sounds: Normal breath sounds.  Lymphadenopathy:     Cervical: No cervical adenopathy.  Skin:    General: Skin is warm and dry.  Neurological:     Mental Status: She is alert and oriented to person, place, and time.  Psychiatric:        Behavior: Behavior normal.        Thought Content: Thought content normal.        Judgment: Judgment normal.      No results found for any visits on 04/07/19.     Assessment & Plan    1. Diabetes mellitus type 2 with complications, uncontrolled (HCC) Actos 65m daily.  2. HYPERLIPIDEMIA TYPE IIB / III On lipitor. Check lipids.  3. HYPERTENSION, BENIGN On lisinopril.  4. CAD in native artery Risk factors treated.      GCranford Mon MD  [facility] IFritzi MandesWolford,acting as a scribe for RWilhemena Durie MD.,have documented all relevant documentation on the behalf of RWilhemena Durie MD,as directed by  RWilhemena Durie MD while in the presence of RWilhemena Durie MD.

## 2019-07-05 ENCOUNTER — Other Ambulatory Visit: Payer: Self-pay

## 2019-07-05 DIAGNOSIS — Z20822 Contact with and (suspected) exposure to covid-19: Secondary | ICD-10-CM

## 2019-07-06 LAB — NOVEL CORONAVIRUS, NAA: SARS-CoV-2, NAA: NOT DETECTED

## 2019-07-08 ENCOUNTER — Ambulatory Visit: Payer: Self-pay | Admitting: Family Medicine

## 2019-07-14 NOTE — Progress Notes (Signed)
Patient: Kelly Manning Female    DOB: 1955/11/03   63 y.o.   MRN: [id number] Visit Date: 07/15/2019  Today's Provider: Wilhemena Durie, MD   Chief Complaint  Patient presents with  . Follow-up  . Diabetes  . Hyperlipidemia  . Hypertension   Subjective:     HPI  She now has insurance so we can work on her medications at this time.  She has been doing well and feeling well.  She has not been checking her blood sugars. Diabetes mellitus type 2 with complications, uncontrolled (HCC) From 04/07/2019-Actos 50m daily.  Blood sugar readings at home averaging around 250.  HYPERLIPIDEMIA TYPE IIB / III From 04/07/2019-On lipitor. Check lipids.  HYPERTENSION, BENIGN From 04/07/2019-On lisinopril.  CAD in native artery From 04/07/2019-Risk factors treated.   Allergies  Allergen Reactions  . Clopidogrel Bisulfate     REACTION: rash     Current Outpatient Medications:  .  aspirin 81 MG tablet, Take by mouth., Disp: , Rfl:  .  atorvastatin (LIPITOR) 40 MG tablet, Take 1 tablet (40 mg total) by mouth daily., Disp: 30 tablet, Rfl: 3 .  Blood Glucose Monitoring Suppl (ONE TOUCH ULTRA SYSTEM KIT) w/Device KIT, 1 kit by Does not apply route 2 (two) times daily. Test strips only. Dx: E11.8, Disp: 100 each, Rfl: 12 .  glipiZIDE (GLUCOTROL) 10 MG tablet, Take 1 tablet (10 mg total) by mouth daily., Disp: 30 tablet, Rfl: 3 .  lisinopril (ZESTRIL) 5 MG tablet, Take 1 tablet (5 mg total) by mouth daily., Disp: 30 tablet, Rfl: 3 .  metFORMIN (GLUCOPHAGE) 1000 MG tablet, Take 1 tablet (1,000 mg total) by mouth 2 (two) times daily with a meal., Disp: 60 tablet, Rfl: 3 .  ONE TOUCH ULTRA TEST test strip, 1 each by Other route 2 (two) times daily. for testing, Disp: 100 each, Rfl: 12 .  pioglitazone (ACTOS) 15 MG tablet, Take 1 tablet by mouth once daily, Disp: 30 tablet, Rfl: 0 .  sitaGLIPtin (JANUVIA) 100 MG tablet, Take 1 tablet (100 mg total) by mouth daily., Disp: 30 tablet,  Rfl: 3 .  sitaGLIPtin-metformin (JANUMET) 50-1000 MG tablet, Take 1 tablet by mouth 2 (two) times daily. (Patient not taking: Reported on 07/15/2019), Disp: 180 tablet, Rfl: 3  Review of Systems  Constitutional: Negative for appetite change, chills, fatigue and fever.  Eyes: Negative.   Respiratory: Negative for chest tightness and shortness of breath.   Cardiovascular: Negative for chest pain and palpitations.  Gastrointestinal: Negative for abdominal pain, nausea and vomiting.  Endocrine: Negative.   Allergic/Immunologic: Negative.   Neurological: Negative for dizziness and weakness.  Psychiatric/Behavioral: Negative.     Social History   Tobacco Use  . Smoking status: Never Smoker  . Smokeless tobacco: Never Used  . Tobacco comment: tobacco use- no   Substance Use Topics  . Alcohol use: No      Objective:   BP 130/79 (BP Location: Right Arm, Patient Position: Sitting, Cuff Size: Large)   Pulse 90   Temp (!) 96.8 F (36 C) (Other (Comment))   Resp 16   Wt 197 lb (89.4 kg)   SpO2 98%   BMI 31.32 kg/m  Vitals:   07/15/19 1557  BP: 130/79  Pulse: 90  Resp: 16  Temp: (!) 96.8 F (36 C)  TempSrc: Other (Comment)  SpO2: 98%  Weight: 197 lb (89.4 kg)  Body mass index is 31.32 kg/m.   Physical Exam Vitals signs reviewed.  Constitutional:      Appearance: She is well-developed.  HENT:     Head: Normocephalic.  Eyes:     General: No scleral icterus. Neck:     Thyroid: No thyromegaly.  Cardiovascular:     Rate and Rhythm: Normal rate and regular rhythm.     Heart sounds: Normal heart sounds.  Pulmonary:     Effort: Pulmonary effort is normal.     Breath sounds: Normal breath sounds.  Musculoskeletal:     Right lower leg: No edema.     Left lower leg: No edema.  Lymphadenopathy:     Cervical: No cervical adenopathy.  Skin:    General: Skin is warm and dry.  Neurological:     General: No focal deficit present.     Mental Status: She is alert and  oriented to person, place, and time.  Psychiatric:        Mood and Affect: Mood normal.        Behavior: Behavior normal.        Thought Content: Thought content normal.        Judgment: Judgment normal.      Results for orders placed or performed in visit on 07/15/19  POCT glycosylated hemoglobin (Hb A1C)  Result Value Ref Range   Hemoglobin A1C 13.3 (A) 4.0 - 5.6 %       Assessment & Plan    1. Diabetes mellitus type 2 with complications, uncontrolled ([place]) Diabetes very poorly controlled.  At this time will stop Metformin because of kidney function.  We will hold Januvia as it is in the medicien [facility]--1 pill every morning.  She is given 2 weeks of samples and I will see her back in 2 weeks.  She is to take fasting blood sugar every morning - POCT glycosylated hemoglobin (Hb A1C)--13.3 - sitaGLIPtin (JANUVIA) 100 MG tablet; Take 1 tablet (100 mg total) by mouth daily.  Dispense: 30 tablet; Refill: 3 - Renal function panel  2. Need for influenza vaccination  - Flu Vaccine QUAD 36+ mos IM  3. CAD in native artery All risk factors treated.  Intervention was 2011 with stent - Renal function panel  4. 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 Hypercholesteremia On atorvastatin  6. HYPERTENSION, BENIGN On lisinopril.  Follow up in 2 weeks for diabetes.   Makayli Bracken Cranford Mon, MD  [facility]

## 2019-07-15 ENCOUNTER — Encounter: Payer: Self-pay | Admitting: Family Medicine

## 2019-07-15 ENCOUNTER — Ambulatory Visit (INDEPENDENT_AMBULATORY_CARE_PROVIDER_SITE_OTHER): Payer: Managed Care, Other (non HMO) | Admitting: Family Medicine

## 2019-07-15 ENCOUNTER — Other Ambulatory Visit: Payer: Self-pay

## 2019-07-15 VITALS — BP 130/79 | HR 90 | Temp 96.8°F | Resp 16 | Wt 197.0 lb

## 2019-07-15 DIAGNOSIS — E118 Type 2 diabetes mellitus with unspecified complications: Secondary | ICD-10-CM | POA: Diagnosis not present

## 2019-07-15 DIAGNOSIS — E7800 Pure hypercholesterolemia, unspecified: Secondary | ICD-10-CM | POA: Diagnosis not present

## 2019-07-15 DIAGNOSIS — I10 Essential (primary) hypertension: Secondary | ICD-10-CM

## 2019-07-15 DIAGNOSIS — I25119 Atherosclerotic heart disease of native coronary artery with unspecified angina pectoris: Secondary | ICD-10-CM | POA: Diagnosis not present

## 2019-07-15 DIAGNOSIS — I2510 Atherosclerotic heart disease of native coronary artery without angina pectoris: Secondary | ICD-10-CM | POA: Diagnosis not present

## 2019-07-15 DIAGNOSIS — E1165 Type 2 diabetes mellitus with hyperglycemia: Secondary | ICD-10-CM | POA: Diagnosis not present

## 2019-07-15 DIAGNOSIS — Z23 Encounter for immunization: Secondary | ICD-10-CM

## 2019-07-15 DIAGNOSIS — IMO0002 Reserved for concepts with insufficient information to code with codable children: Secondary | ICD-10-CM

## 2019-07-15 LAB — POCT GLYCOSYLATED HEMOGLOBIN (HGB A1C): Hemoglobin A1C: 13.3 % — AB (ref 4.0–5.6)

## 2019-07-15 MED ORDER — SITAGLIPTIN PHOSPHATE 100 MG PO TABS: 100.0000 mg | ORAL_TABLET | Freq: Every day | ORAL | 3 refills | Status: DC

## 2019-07-16 ENCOUNTER — Telehealth: Payer: Self-pay

## 2019-07-16 ENCOUNTER — Other Ambulatory Visit: Payer: Self-pay | Admitting: Family Medicine

## 2019-07-16 DIAGNOSIS — IMO0002 Reserved for concepts with insufficient information to code with codable children: Secondary | ICD-10-CM

## 2019-07-16 DIAGNOSIS — E118 Type 2 diabetes mellitus with unspecified complications: Secondary | ICD-10-CM

## 2019-07-16 DIAGNOSIS — E1165 Type 2 diabetes mellitus with hyperglycemia: Secondary | ICD-10-CM

## 2019-07-16 LAB — RENAL FUNCTION PANEL
Albumin: 4.5 g/dL (ref 3.8–4.8)
BUN/Creatinine Ratio: 24 (ref 12–28)
BUN: 32 mg/dL — ABNORMAL HIGH (ref 8–27)
CO2: 17 mmol/L — ABNORMAL LOW (ref 20–29)
Calcium: 9.5 mg/dL (ref 8.7–10.3)
Chloride: 101 mmol/L (ref 96–106)
Creatinine, Ser: 1.35 mg/dL — ABNORMAL HIGH (ref 0.57–1.00)
GFR calc Af Amer: 49 mL/min/{1.73_m2} — ABNORMAL LOW (ref >59)
GFR calc non Af Amer: 42 mL/min/{1.73_m2} — ABNORMAL LOW (ref >59)
Glucose: 263 mg/dL — ABNORMAL HIGH (ref 65–99)
Phosphorus: 3.7 mg/dL (ref 3.0–4.3)
Potassium: 5.4 mmol/L — ABNORMAL HIGH (ref 3.5–5.2)
Sodium: 134 mmol/L (ref 134–144)

## 2019-07-16 NOTE — Telephone Encounter (Signed)
Received a fax from the pharmacy that Kelly Manning is not covered by insurance. It does mention that Metformin 500mg , Farxiga 10mg  , and Invokana 300mg  IS covered.  Patient has tried and failed Metformin and Invokana. Would you like to try [place]? Or proceed with the PA for Januvia? Please advise. Thanks!

## 2019-07-17 NOTE — Telephone Encounter (Signed)
Stop januvia--we will try the farziga .

## 2019-07-19 ENCOUNTER — Encounter: Payer: Self-pay | Admitting: *Deleted

## 2019-07-19 NOTE — Telephone Encounter (Signed)
The samples she has have Januvia in them.

## 2019-07-19 NOTE — Telephone Encounter (Signed)
So sorry for the confusion. She was seen in the office last week, and she was advised to hold Januvia and she was given samples of [place] 5/100mg  tablets. She has a follow up appt with you next week to discuss. She has tolerated the medication well so far.   Januvia was on "auto refill" at the pharmacy, and that is how we received the fax needing a PA from them. Patient will continue to take [place] 5/100mg  until she is seen here in the office. Just FYI. Thanks!

## 2019-07-20 ENCOUNTER — Other Ambulatory Visit: Payer: Self-pay

## 2019-07-20 DIAGNOSIS — IMO0002 Reserved for concepts with insufficient information to code with codable children: Secondary | ICD-10-CM

## 2019-07-20 DIAGNOSIS — E1165 Type 2 diabetes mellitus with hyperglycemia: Secondary | ICD-10-CM

## 2019-07-20 MED ORDER — PIOGLITAZONE HCL 15 MG PO TABS: 15.0000 mg | ORAL_TABLET | Freq: Every day | ORAL | 11 refills | Status: DC

## 2019-07-20 NOTE — Telephone Encounter (Signed)
Patient called requesting refills on medication. [place]

## 2019-07-29 ENCOUNTER — Ambulatory Visit: Payer: Managed Care, Other (non HMO) | Admitting: Family Medicine

## 2019-07-29 ENCOUNTER — Other Ambulatory Visit: Payer: Self-pay

## 2019-07-29 ENCOUNTER — Encounter: Payer: Self-pay | Admitting: Family Medicine

## 2019-07-29 VITALS — BP 115/71 | HR 83 | Temp 96.9°F | Resp 18 | Ht 67.0 in | Wt 190.0 lb

## 2019-07-29 DIAGNOSIS — E118 Type 2 diabetes mellitus with unspecified complications: Secondary | ICD-10-CM

## 2019-07-29 DIAGNOSIS — N1832 Chronic kidney disease, stage 3b: Secondary | ICD-10-CM | POA: Diagnosis not present

## 2019-07-29 DIAGNOSIS — E1165 Type 2 diabetes mellitus with hyperglycemia: Secondary | ICD-10-CM

## 2019-07-29 DIAGNOSIS — R809 Proteinuria, unspecified: Secondary | ICD-10-CM | POA: Diagnosis not present

## 2019-07-29 DIAGNOSIS — IMO0002 Reserved for concepts with insufficient information to code with codable children: Secondary | ICD-10-CM

## 2019-07-29 MED ORDER — STEGLUJAN 5-100 MG PO TABS: 1.0000 | ORAL_TABLET | Freq: Every day | ORAL | 12 refills | Status: DC

## 2019-07-29 NOTE — Progress Notes (Signed)
Patient: Kelly Manning Female    DOB: 13-Sep-1956   63 y.o.   MRN: [id number] Visit Date: 07/29/2019  Today's Provider: Wilhemena Durie, MD   Chief Complaint  Patient presents with  . Follow-up  . Diabetes   Subjective:     HPI   Diabetes mellitus type 2 with complications, uncontrolled ([place]) From 07/15/2019-Diabetes very poorly controlled.  At this time will stop Metformin because of kidney function.  We will hold Januvia as it is in the medicien [place]--1 pill every morning.  She is given 2 weeks of samples and I will see her back in 2 weeks.  She is to take fasting blood sugar every morning.  Blood sugars are markedly improved.  She also feels better.  Fasting blood sugars at home are 357-70.    Allergies  Allergen Reactions  . Clopidogrel Bisulfate     REACTION: rash     Current Outpatient Medications:  .  aspirin 81 MG tablet, Take by mouth., Disp: , Rfl:  .  atorvastatin (LIPITOR) 40 MG tablet, Take 1 tablet (40 mg total) by mouth daily., Disp: 30 tablet, Rfl: 3 .  Blood Glucose Monitoring Suppl (ONE TOUCH ULTRA SYSTEM KIT) w/Device KIT, 1 kit by Does not apply route 2 (two) times daily. Test strips only. Dx: E11.8, Disp: 100 each, Rfl: 12 .  glipiZIDE (GLUCOTROL) 10 MG tablet, Take 1 tablet (10 mg total) by mouth daily., Disp: 30 tablet, Rfl: 3 .  lisinopril (ZESTRIL) 5 MG tablet, Take 1 tablet (5 mg total) by mouth daily., Disp: 30 tablet, Rfl: 3 .  metFORMIN (GLUCOPHAGE) 1000 MG tablet, Take 1 tablet (1,000 mg total) by mouth 2 (two) times daily with a meal., Disp: 60 tablet, Rfl: 3 .  ONE TOUCH ULTRA TEST test strip, 1 each by Other route 2 (two) times daily. for testing, Disp: 100 each, Rfl: 12 .  pioglitazone (ACTOS) 15 MG tablet, Take 1 tablet (15 mg total) by mouth daily., Disp: 30 tablet, Rfl: 11 .  sitaGLIPtin-metformin (JANUMET) 50-1000 MG tablet, Take 1 tablet by mouth 2 (two) times daily., Disp: 180 tablet, Rfl: 3 .  sitaGLIPtin (JANUVIA) 100  MG tablet, Take 1 tablet (100 mg total) by mouth daily. (Patient not taking: Reported on 07/29/2019), Disp: 30 tablet, Rfl: 3  Review of Systems  Constitutional: Negative for appetite change, chills, fatigue and fever.  Respiratory: Negative for chest tightness and shortness of breath.   Cardiovascular: Negative for chest pain and palpitations.  Gastrointestinal: Negative for abdominal pain, nausea and vomiting.  Neurological: Negative for dizziness and weakness.    Social History   Tobacco Use  . Smoking status: Never Smoker  . Smokeless tobacco: Never Used  . Tobacco comment: tobacco use- no   Substance Use Topics  . Alcohol use: No      Objective:   BP 115/71 (BP Location: Right Arm, Patient Position: Sitting, Cuff Size: Large)   Pulse 83   Temp (!) 96.9 F (36.1 C) (Other (Comment))   Resp 18   Ht _0  (1.702 m)   Wt 190 lb (86.2 kg)   SpO2 99%   BMI 29.76 kg/m  Vitals:   07/29/19 1548  BP: 115/71  Pulse: 83  Resp: 18  Temp: (!) 96.9 F (36.1 C)  TempSrc: Other (Comment)  SpO2: 99%  Weight: 190 lb (86.2 kg)  Height: _1  (1.702 m)  Body mass index is 29.76 kg/m.   Physical Exam Vitals signs  reviewed.  Constitutional:      Appearance: She is well-developed.  HENT:     Head: Normocephalic.  Eyes:     General: No scleral icterus. Neck:     Thyroid: No thyromegaly.  Cardiovascular:     Rate and Rhythm: Normal rate and regular rhythm.     Heart sounds: Normal heart sounds.  Pulmonary:     Effort: Pulmonary effort is normal.     Breath sounds: Normal breath sounds.  Musculoskeletal:     Right lower leg: No edema.     Left lower leg: No edema.  Lymphadenopathy:     Cervical: No cervical adenopathy.  Skin:    General: Skin is warm and dry.  Neurological:     General: No focal deficit present.     Mental Status: She is alert and oriented to person, place, and time.  Psychiatric:        Mood and Affect: Mood normal.        Behavior: Behavior  normal.        Thought Content: Thought content normal.        Judgment: Judgment normal.      No results found for any visits on 07/29/19.     Assessment & Plan   1. Diabetes mellitus type 2 with complications, uncontrolled (HCC) -Ertugliflozin-SITagliptin (STEGLUJAN) 5-100 MG TABS; Take 1 tablet by mouth daily. 30 tablets with 12 refills.  Stop Metformin completely. Follow up in 3 months for DM.  2 chronic kidney disease/3     I,April Miller,acting as a scribe for Wilhemena Durie, MD.,have documented all relevant documentation on the behalf of Wilhemena Durie, MD,as directed by  Wilhemena Durie, MD while in the presence of Wilhemena Durie, MD.   Wilhemena Durie, MD  [facility]

## 2019-08-10 ENCOUNTER — Telehealth: Payer: Self-pay | Admitting: Family Medicine

## 2019-08-10 NOTE — Telephone Encounter (Signed)
From [facility], please review 

## 2019-08-10 NOTE — Telephone Encounter (Signed)
LMOVM for pt to return call 

## 2019-08-10 NOTE — Telephone Encounter (Signed)
Did she try to card that we gave her at the pharmacy?

## 2019-08-10 NOTE — Telephone Encounter (Signed)
Ertugliflozin-SITagliptin (STEGLUJAN) 5-100 MG TABS   Patient states that she is having a hard time affording this medication. She is requesting call back to discuss a possible alternative. Please advise.

## 2019-08-11 NOTE — Telephone Encounter (Signed)
LMOVM for pt to return call 

## 2019-08-12 NOTE — Telephone Encounter (Signed)
Have given patient new coupon cards to try

## 2019-08-16 ENCOUNTER — Other Ambulatory Visit: Payer: Self-pay

## 2019-08-16 DIAGNOSIS — Z20822 Contact with and (suspected) exposure to covid-19: Secondary | ICD-10-CM

## 2019-08-17 ENCOUNTER — Other Ambulatory Visit: Payer: Self-pay | Admitting: Family Medicine

## 2019-08-17 DIAGNOSIS — E1165 Type 2 diabetes mellitus with hyperglycemia: Secondary | ICD-10-CM

## 2019-08-17 DIAGNOSIS — E782 Mixed hyperlipidemia: Secondary | ICD-10-CM

## 2019-08-17 DIAGNOSIS — IMO0002 Reserved for concepts with insufficient information to code with codable children: Secondary | ICD-10-CM

## 2019-08-17 DIAGNOSIS — I10 Essential (primary) hypertension: Secondary | ICD-10-CM

## 2019-08-18 LAB — NOVEL CORONAVIRUS, NAA: SARS-CoV-2, NAA: NOT DETECTED

## 2019-09-28 ENCOUNTER — Other Ambulatory Visit: Payer: Self-pay | Admitting: Family Medicine

## 2019-09-28 DIAGNOSIS — I10 Essential (primary) hypertension: Secondary | ICD-10-CM

## 2019-09-28 DIAGNOSIS — E782 Mixed hyperlipidemia: Secondary | ICD-10-CM

## 2019-09-28 DIAGNOSIS — IMO0002 Reserved for concepts with insufficient information to code with codable children: Secondary | ICD-10-CM

## 2019-09-28 DIAGNOSIS — E1165 Type 2 diabetes mellitus with hyperglycemia: Secondary | ICD-10-CM

## 2019-11-01 ENCOUNTER — Ambulatory Visit: Payer: Self-pay | Admitting: Family Medicine

## 2019-11-05 ENCOUNTER — Other Ambulatory Visit: Payer: Self-pay | Admitting: Family Medicine

## 2019-11-05 DIAGNOSIS — E782 Mixed hyperlipidemia: Secondary | ICD-10-CM

## 2019-11-05 DIAGNOSIS — E1165 Type 2 diabetes mellitus with hyperglycemia: Secondary | ICD-10-CM

## 2019-11-05 DIAGNOSIS — I10 Essential (primary) hypertension: Secondary | ICD-10-CM

## 2019-11-05 DIAGNOSIS — IMO0002 Reserved for concepts with insufficient information to code with codable children: Secondary | ICD-10-CM

## 2019-12-07 ENCOUNTER — Other Ambulatory Visit: Payer: Self-pay | Admitting: Family Medicine

## 2019-12-07 DIAGNOSIS — IMO0002 Reserved for concepts with insufficient information to code with codable children: Secondary | ICD-10-CM

## 2019-12-07 DIAGNOSIS — E782 Mixed hyperlipidemia: Secondary | ICD-10-CM

## 2019-12-07 DIAGNOSIS — I10 Essential (primary) hypertension: Secondary | ICD-10-CM

## 2019-12-07 DIAGNOSIS — E1165 Type 2 diabetes mellitus with hyperglycemia: Secondary | ICD-10-CM

## 2021-09-04 ENCOUNTER — Encounter: Payer: Self-pay | Admitting: Physician Assistant

## 2022-10-18 NOTE — Progress Notes (Unsigned)
I,Kelly Manning,acting as a Education administrator for [employer], MD.,have documented all relevant documentation on the behalf of Kelly Foster, MD,as directed by  Kelly Foster, MD while in the presence of Kelly Foster, MD.  New patient visit   Patient: Kelly Manning   DOB: [birth year]   67 y.o. Female  MRN: [id number] Visit Date: 10/21/2022  Today's healthcare provider: [name], MD   No chief complaint on file.  Subjective    Kelly Manning is a 67 y.o. female who presents today as a new patient to establish care.  HPI  -Eye Exam: 3 years ago -Mammogram: updated order needed.  -Pap Smear: last with Dr.Gilbert 2020 -Colonoscopy: Place order, needed  -Dexa Scan: place order, needed    DM  Patient is presenting for refills  States that she has had trouble with getting refills for [place] and janumet in the past due to insurance coverage  Recommended to have updated eye exam   HTN  She states that she has always had normal blood pressure  She denies HA, chest pain, vision changes today  States she has been out of medication for BP for 1 week   HLD She is requesting a refill for atorvastatin 40mg  daily  Reports regular adherence to this medication when she has adequate supply   Past Medical History:  Diagnosis Date   CAD (coronary artery disease)    -s/p DES to LAD in May 2011   DM2 (diabetes mellitus, type 2) ([place])    Hyperlipidemia    Hypertension    Past Surgical History:  Procedure Laterality Date   CARDIAC CATHETERIZATION     DILATION AND CURETTAGE OF UTERUS     PARTIAL HYSTERECTOMY     Family Status  Relation Name Status   Mother  Deceased at age 23   Sister  25   Brother  Alive   Sister  Alive   Sister  Alive   Sister  Deceased   Sister  Deceased       80 months after birth   Brother  87   Brother  Alive   Brother  58   Brother  Deceased   Brother  Deceased   Father  Deceased at  age 30       suicide   Neg Hx  (Not Specified)   Family History  Problem Relation Age of Onset   Diabetes Mother    Ovarian cancer Mother    Coronary artery disease Mother    Diabetes Sister    Diabetes Brother    Heart failure Brother    Diabetes Sister    Diabetes Sister    Diabetes Sister    Heart failure Sister    Diabetes Sister    Diabetes Brother    Diabetes Brother    Diabetes Brother    Diabetes Brother    Cancer Brother        lung cancer   Diabetes Brother    Breast cancer Neg Hx    Social History   Socioeconomic History   Marital status: Single    Spouse name: Not on file   Number of children: Not on file   Years of education: Not on file   Highest education level: Not on file  Occupational History   Not on file  Tobacco Use   Smoking status: Never   Smokeless tobacco: Never   Tobacco comments:    tobacco use- no   Substance and Sexual Activity   Alcohol  use: No   Drug use: No   Sexual activity: Not on file  Other Topics Concern   Not on file  Social History Narrative   Regularly exercise. Single. Full time.    Social Determinants of Health   Financial Resource Strain: Not on file  Food Insecurity: Not on file  Transportation Needs: Not on file  Physical Activity: Not on file  Stress: Not on file  Social Connections: Not on file   Outpatient Medications Prior to Visit  Medication Sig   aspirin 81 MG tablet Take by mouth.   Blood Glucose Monitoring Suppl (ONE TOUCH ULTRA SYSTEM KIT) w/Device KIT 1 kit by Does not apply route 2 (two) times daily. Test strips only. Dx: E11.8   lisinopril (ZESTRIL) 5 MG tablet Take 1 tablet by mouth once daily   ONE TOUCH ULTRA TEST test strip 1 each by Other route 2 (two) times daily. for testing   [DISCONTINUED] atorvastatin (LIPITOR) 40 MG tablet Take 1 tablet by mouth once daily   [DISCONTINUED] glipiZIDE (GLUCOTROL) 10 MG tablet Take 1 tablet by mouth once daily   [DISCONTINUED] pioglitazone (ACTOS) 15  MG tablet Take 1 tablet (15 mg total) by mouth daily.   [DISCONTINUED] Ertugliflozin-SITagliptin (STEGLUJAN) 5-100 MG TABS Take 1 tablet by mouth daily. (Patient not taking: Reported on 10/21/2022)   [DISCONTINUED] KOMBIGLYZE XR 2.01-999 MG TB24 Take 2 tablets by mouth every morning.   [DISCONTINUED] metFORMIN (GLUCOPHAGE) 1000 MG tablet Take 1 tablet (1,000 mg total) by mouth 2 (two) times daily with a meal. (Patient not taking: Reported on 10/21/2022)   [DISCONTINUED] sitaGLIPtin (JANUVIA) 100 MG tablet Take 1 tablet (100 mg total) by mouth daily. (Patient not taking: Reported on 07/29/2019)   [DISCONTINUED] sitaGLIPtin-metformin (JANUMET) 50-1000 MG tablet Take 1 tablet by mouth 2 (two) times daily. (Patient not taking: Reported on 10/21/2022)   No facility-administered medications prior to visit.   Allergies  Allergen Reactions   Clopidogrel Bisulfate Rash    REACTION: rash    Immunization History  Administered Date(s) Administered   Influenza Inj Mdck Quad Pf 10/18/2021   Influenza,inj,Quad PF,6+ Mos 07/08/2016, 07/15/2019   Influenza-Unspecified 07/08/2016, 07/15/2019, 06/20/2020   PFIZER Comirnaty(Gray Top)Covid-19 Tri-Sucrose Vaccine 05/08/2021, 05/30/2021   PNEUMOCOCCAL CONJUGATE-20 01/16/2022   Pfizer Covid-19 Vaccine Bivalent Booster 45yrs & up 10/26/2021   Pneumococcal Polysaccharide-23 03/18/2011   Tdap 10/29/2012   Zoster Recombinat (Shingrix) 06/20/2020, 11/01/2020    Health Maintenance  Topic Date Due   HIV Screening  Never done   Diabetic kidney evaluation - Urine ACR  Never done   OPHTHALMOLOGY EXAM  07/29/2017   MAMMOGRAM  02/07/2018   PAP SMEAR-Modifier  06/18/2018   FOOT EXAM  04/15/2019   HEMOGLOBIN A1C  01/13/2020   Diabetic kidney evaluation - eGFR measurement  07/14/2020   COLONOSCOPY (Pts 45-53yrs Insurance coverage will need to be confirmed)  05/05/2021   INFLUENZA VACCINE  04/23/2022   COVID-19 Vaccine (4 - 2023-24 season) 05/24/2022   DEXA SCAN  Never  done   DTaP/Tdap/Td (2 - Td or Tdap) 10/29/2022   Pneumonia Vaccine 67+ Years old  Completed   Hepatitis C Screening  Completed   Zoster Vaccines- Shingrix  Completed   HPV VACCINES  Aged Out    Patient Care Team: Kelly Foster, MD as PCP - General (Family Medicine) Minna Merritts, MD as Consulting Physician (Cardiology)  Review of Systems     Objective    BP (!) 146/85 (BP Location: Right Arm, Patient Position: Sitting, Cuff  Size: Normal)   Pulse 73   Ht 5\' 6"  (1.676 m)   Wt 179 lb (81.2 kg)   SpO2 100%   BMI 28.89 kg/m    Physical Exam Vitals reviewed.  Constitutional:      General: She is not in acute distress.    Appearance: Normal appearance. She is not ill-appearing, toxic-appearing or diaphoretic.  Eyes:     Conjunctiva/sclera: Conjunctivae normal.  Cardiovascular:     Rate and Rhythm: Normal rate and regular rhythm.     Pulses: Normal pulses.     Heart sounds: Normal heart sounds. No murmur heard.    No friction rub. No gallop.  Pulmonary:     Effort: Pulmonary effort is normal. No respiratory distress.     Breath sounds: Normal breath sounds. No stridor. No wheezing, rhonchi or rales.  Abdominal:     General: Bowel sounds are normal. There is no distension.     Palpations: Abdomen is soft.     Tenderness: There is no abdominal tenderness.  Musculoskeletal:     Right lower leg: No edema.     Left lower leg: No edema.  Skin:    Findings: No erythema or rash.  Neurological:     Mental Status: She is alert and oriented to person, place, and time.     Depression Screen    07/15/2019    3:56 PM 12/30/2017    2:30 PM 11/18/2016    8:24 AM  PHQ 2/9 Scores  PHQ - 2 Score 0 1 2  PHQ- 9 Score 0 3 2   No results found for any visits on 10/21/22.  Assessment & Plan      Problem List Items Addressed This Visit       Cardiovascular and Mediastinum   HYPERTENSION, BENIGN    BP elevated  Chronic, not at goal  Has been out of lisinopril  5mg  for 1 week  Refills provided  BMP collected today  Follow up in 1 month after she has been able to get back on blood pressure medication regularly       Relevant Medications   atorvastatin (LIPITOR) 40 MG tablet   Other Relevant Orders   Basic Metabolic Panel (BMET)     Endocrine   Hyperglycemia due to type 2 diabetes mellitus ([place]) - Primary    Patient with uncontrolled DM  Has had to change medications due to insurance coverage  Refills provided for actos 15mg  daily, glipizide 10mg  daily, Kombiglyz 2.5-1,000 mg  Repeat A1c collected today  Urine microalbumin collected today  Follow up in 1 month        Relevant Medications   atorvastatin (LIPITOR) 40 MG tablet   glipiZIDE (GLUCOTROL) 10 MG tablet   KOMBIGLYZE XR 2.01-999 MG TB24   pioglitazone (ACTOS) 15 MG tablet   Hyperlipidemia associated with type 2 diabetes mellitus (HCC)    Refills provided for atorvastatin 40mg  daily  Continue current medications        Relevant Medications   atorvastatin (LIPITOR) 40 MG tablet   glipiZIDE (GLUCOTROL) 10 MG tablet   KOMBIGLYZE XR 2.01-999 MG TB24   pioglitazone (ACTOS) 15 MG tablet   Other Relevant Orders   Urine microalbumin-creatinine with uACR   Hemoglobin A1c     Other   HYPERLIPIDEMIA TYPE IIB / III    Continue atorvastatin 40mg  daily  Refills provided       Relevant Medications   atorvastatin (LIPITOR) 40 MG tablet   Healthcare maintenance  Mammogram ordered  DEXA scan ordered  A1c repeat ordered  Urine microalbumin ordered  Referral to gastroenterology ordered for colonoscopy        Encounter for screening mammogram for malignant neoplasm of breast    Mammogram ordered         Relevant Orders   MM 3D SCREEN BREAST BILATERAL   Screening for colon cancer    Referral to GI for colonoscopy submitted       Relevant Orders   Ambulatory referral to Gastroenterology   Postmenopausal estrogen deficiency    DEXA scan ordered       Relevant  Orders   DG Bone Density     Return in about 1 month (around 11/21/2022) for HTN, DM.       The entirety of the information documented in the History of Present Illness, Review of Systems and Physical Exam were personally obtained by me. Portions of this information were initially documented by Acey Lav, CMA and reviewed by me for thoroughness and accuracy.Ronnald Ramp, MD     Ronnald Ramp, MD  [facility] [phone] (phone) [phone] (fax)  [facility]

## 2022-10-21 ENCOUNTER — Ambulatory Visit (INDEPENDENT_AMBULATORY_CARE_PROVIDER_SITE_OTHER): Payer: 59 | Admitting: Family Medicine

## 2022-10-21 ENCOUNTER — Encounter: Payer: Self-pay | Admitting: Family Medicine

## 2022-10-21 VITALS — BP 146/85 | HR 73 | Ht 66.0 in | Wt 179.0 lb

## 2022-10-21 DIAGNOSIS — Z1211 Encounter for screening for malignant neoplasm of colon: Secondary | ICD-10-CM | POA: Insufficient documentation

## 2022-10-21 DIAGNOSIS — Z0000 Encounter for general adult medical examination without abnormal findings: Secondary | ICD-10-CM

## 2022-10-21 DIAGNOSIS — E1169 Type 2 diabetes mellitus with other specified complication: Secondary | ICD-10-CM | POA: Insufficient documentation

## 2022-10-21 DIAGNOSIS — E11649 Type 2 diabetes mellitus with hypoglycemia without coma: Secondary | ICD-10-CM | POA: Diagnosis not present

## 2022-10-21 DIAGNOSIS — Z1231 Encounter for screening mammogram for malignant neoplasm of breast: Secondary | ICD-10-CM

## 2022-10-21 DIAGNOSIS — Z780 Asymptomatic menopausal state: Secondary | ICD-10-CM

## 2022-10-21 DIAGNOSIS — E1165 Type 2 diabetes mellitus with hyperglycemia: Secondary | ICD-10-CM

## 2022-10-21 DIAGNOSIS — I10 Essential (primary) hypertension: Secondary | ICD-10-CM

## 2022-10-21 DIAGNOSIS — E785 Hyperlipidemia, unspecified: Secondary | ICD-10-CM

## 2022-10-21 DIAGNOSIS — E782 Mixed hyperlipidemia: Secondary | ICD-10-CM

## 2022-10-21 MED ORDER — GLIPIZIDE 10 MG PO TABS: 10.0000 mg | ORAL_TABLET | Freq: Every day | ORAL | 1 refills | Status: DC

## 2022-10-21 MED ORDER — KOMBIGLYZE XR 2.5-1000 MG PO TB24
2.0000 | ORAL_TABLET | Freq: Every morning | ORAL | 1 refills | Status: DC
Start: 2022-10-21 — End: 2022-11-21

## 2022-10-21 MED ORDER — ATORVASTATIN CALCIUM 40 MG PO TABS: 40.0000 mg | ORAL_TABLET | Freq: Every day | ORAL | 1 refills | Status: DC

## 2022-10-21 MED ORDER — PIOGLITAZONE HCL 15 MG PO TABS: 15.0000 mg | ORAL_TABLET | Freq: Every day | ORAL | 11 refills | Status: DC

## 2022-10-21 NOTE — Patient Instructions (Addendum)
Please continue your medications as prescribed for blood pressure and diabetes    Please purchase a blood pressure cuff and record your blood pressure for the next few weeks   Your goal blood pressure is less than 140/90.   Please follow up in 1 month for you blood pressure and diabetes.

## 2022-10-21 NOTE — Assessment & Plan Note (Signed)
Patient with uncontrolled DM  Has had to change medications due to insurance coverage  Refills provided for actos 15mg  daily, glipizide 10mg  daily, Kombiglyz 2.5-1,000 mg  Repeat A1c collected today  Urine microalbumin collected today  Follow up in 1 month

## 2022-10-21 NOTE — Assessment & Plan Note (Addendum)
Mammogram ordered

## 2022-10-21 NOTE — Assessment & Plan Note (Signed)
Refills provided for atorvastatin 40mg  daily  Continue current medications

## 2022-10-21 NOTE — Assessment & Plan Note (Signed)
BP elevated  Chronic, not at goal  Has been out of lisinopril 5mg  for 1 week  Refills provided  BMP collected today  Follow up in 1 month after she has been able to get back on blood pressure medication regularly

## 2022-10-21 NOTE — Assessment & Plan Note (Signed)
Continue atorvastatin 40mg  daily  Refills provided

## 2022-10-21 NOTE — Assessment & Plan Note (Signed)
- 

## 2022-10-21 NOTE — Assessment & Plan Note (Signed)
Referral to GI for colonoscopy submitted

## 2022-10-21 NOTE — Assessment & Plan Note (Signed)
Mammogram ordered  DEXA scan ordered  A1c repeat ordered  Urine microalbumin ordered  Referral to gastroenterology ordered for colonoscopy

## 2022-10-22 ENCOUNTER — Telehealth: Payer: Self-pay | Admitting: Family Medicine

## 2022-10-22 DIAGNOSIS — I10 Essential (primary) hypertension: Secondary | ICD-10-CM

## 2022-10-22 LAB — HEMOGLOBIN A1C
Est. average glucose Bld gHb Est-mCnc: 346 mg/dL
Hgb A1c MFr Bld: 13.7 % — ABNORMAL HIGH (ref 4.8–5.6)

## 2022-10-22 LAB — BASIC METABOLIC PANEL
BUN/Creatinine Ratio: 20 (ref 12–28)
BUN: 24 mg/dL (ref 8–27)
CO2: 21 mmol/L (ref 20–29)
Calcium: 9.7 mg/dL (ref 8.7–10.3)
Chloride: 102 mmol/L (ref 96–106)
Creatinine, Ser: 1.23 mg/dL — ABNORMAL HIGH (ref 0.57–1.00)
Glucose: 424 mg/dL — ABNORMAL HIGH (ref 70–99)
Potassium: 4.9 mmol/L (ref 3.5–5.2)
Sodium: 138 mmol/L (ref 134–144)
eGFR: 49 mL/min/{1.73_m2} — ABNORMAL LOW (ref >59)

## 2022-10-22 MED ORDER — LISINOPRIL 5 MG PO TABS: 5.0000 mg | ORAL_TABLET | Freq: Every day | ORAL | 0 refills | Status: DC

## 2022-10-22 NOTE — Telephone Encounter (Signed)
[facility] faxed refill request for the following medications:   lisinopril (ZESTRIL) 5 MG tablet    Please advise

## 2022-10-23 LAB — SPECIMEN STATUS REPORT

## 2022-10-23 LAB — MICROALBUMIN / CREATININE URINE RATIO
Creatinine, Urine: 34.7 mg/dL
Microalb/Creat Ratio: 242 mg/g creat — ABNORMAL HIGH (ref 0–29)
Microalbumin, Urine: 84 ug/mL

## 2022-10-25 NOTE — Progress Notes (Unsigned)
I,Kelly Manning,acting as a scribe for [employer], MD.,have documented all relevant documentation on the behalf of Kelly Foster, MD,as directed by  Kelly Foster, MD while in the presence of Kelly Foster, MD.  Established patient visit   Patient: Kelly Manning   DOB: [birth year]   67 y.o. Female  MRN: [id number] Visit Date: 10/28/2022  Today's healthcare provider: [name], MD   Chief Complaint  Patient presents with   Follow-up    DM   Subjective    HPI HPI     Follow-up    Additional comments: DM      Last edited by Kelly Manning, CMA on 10/28/2022 10:50 AM.      Patient here for follow-up DM lab result and to further discuss adjustment of medications. Patient reports that she was able to get the Jardiance and she has started taking it. She has decided to take the Jardiance 25mg  instead of Kombiglyze. She states that she expected her A1c to increase because she had not been taking all of her medications as prescribed due to insurance difficulties.  She states her blood glucose levels have been in the 300s, she did not check this AM She states that she has a lot stress involving her family and has been working nights which she believes contributes to her abnormal blood glucose levels She would like to work on her diet along with taking the medications and increasing physical activity prior to considering starting insulin  Myalgias Patient reports that she has been dealing with bilateral lower extremity soreness in her muscles She reports that this pain starts in her thighs and extends to her calf muscles bilaterally She states that she has been evaluated by multiple providers for this issue in the past and has had imaging with no improvement in her symptoms Patient denies weakness in her lower extremities and emphasizes that her symptoms include soreness   Healthcare maintenance Covid Vaccine: per  patient up to date. Eye Exam: not UTD  Medications: Outpatient Medications Prior to Visit  Medication Sig   aspirin 81 MG tablet Take by mouth.   atorvastatin (LIPITOR) 40 MG tablet Take 1 tablet (40 mg total) by mouth daily.   Blood Glucose Monitoring Suppl (ONE TOUCH ULTRA SYSTEM KIT) w/Device KIT 1 kit by Does not apply route 2 (two) times daily. Test strips only. Dx: E11.8   glipiZIDE (GLUCOTROL) 10 MG tablet Take 1 tablet (10 mg total) by mouth daily.   JARDIANCE 25 MG TABS tablet Take 25 mg by mouth daily.   lisinopril (ZESTRIL) 5 MG tablet Take 1 tablet (5 mg total) by mouth daily.   ONE TOUCH ULTRA TEST test strip 1 each by Other route 2 (two) times daily. for testing   pioglitazone (ACTOS) 15 MG tablet Take 1 tablet (15 mg total) by mouth daily.   KOMBIGLYZE XR 2.01-999 MG TB24 Take 2 tablets by mouth every morning. (Patient not taking: Reported on 10/28/2022)   No facility-administered medications prior to visit.    Review of Systems     Objective    Wt 174 lb (78.9 kg)   BMI 28.08 kg/m    Physical Exam Vitals reviewed.  Constitutional:      General: She is not in acute distress.    Appearance: Normal appearance. She is not ill-appearing, toxic-appearing or diaphoretic.  Eyes:     Conjunctiva/sclera: Conjunctivae normal.  Cardiovascular:     Rate and Rhythm: Normal rate and regular rhythm.  Pulses:          Dorsalis pedis pulses are 0 on the right side and 0 on the left side.       Posterior tibial pulses are 0 on the right side and 0 on the left side.     Heart sounds: Normal heart sounds. No murmur heard.    No friction rub. No gallop.  Pulmonary:     Effort: Pulmonary effort is normal. No respiratory distress.     Breath sounds: Normal breath sounds. No stridor. No wheezing, rhonchi or rales.  Abdominal:     General: Bowel sounds are normal. There is no distension.     Palpations: Abdomen is soft.     Tenderness: There is no abdominal tenderness.   Musculoskeletal:     Right lower leg: No edema.     Left lower leg: No edema.     Right foot: Normal range of motion. No deformity.     Left foot: Normal range of motion. No deformity.     Comments: Tender to palpation of bilateral thighs and calf muscles, no swelling  Feet:     Right foot:     Protective Sensation: 6 sites tested.  6 sites sensed.     Skin integrity: Skin integrity normal.     Toenail Condition: Right toenails are normal.     Left foot:     Protective Sensation: 6 sites tested.  6 sites sensed.     Skin integrity: Skin integrity normal.     Toenail Condition: Fungal disease present. Skin:    Findings: No erythema or rash.  Neurological:     Mental Status: She is alert and oriented to person, place, and time.       No results found for any visits on 10/28/22.  Assessment & Plan     Problem List Items Addressed This Visit       Endocrine   Hyperglycemia due to type 2 diabetes mellitus ([place]) - Primary    Last A1c elevated, uncontrolled  A1c 13  Recommended starting insulin therapy for next 3 months  Patient prefers to manage with oral medications since she has been able to restart Jardiance 25mg   Patient will follow up on 11/21/22 for BG check  Recommended that she check fasting and PP glucoses until next appt  She will continue glipizide 10 mg daily, Actos 15 mg daily, Jardiance 25 mg daily Patient is on atorvastatin 40 mg daily and lisinopril 5 mg daily Will repeat A1c in may 2024  Recommended patient schedule appt for DM eye exam  Foot exam completed today        Relevant Medications   JARDIANCE 25 MG TABS tablet     Other   Myalgia    Chronic problem  Not improved  Patient reports soreness in both legs extending to calf muscles  Recommended checking CK and LFT in the next week, lab requisition given to patient Discussed that this could be related to statin medication  Will follow up pending labs and next appt       Relevant Orders   CK  (Creatine Kinase)   Hepatic function panel     Return in about 3 months (around 01/26/2023) for diabetes .       The entirety of the information documented in the History of Present Illness, Review of Systems and Physical Exam were personally obtained by me. Portions of this information were initially documented by Kelly Manning, CMA and reviewed by me  for thoroughness and accuracy.Kelly Foster, MD     Kelly Foster, MD  [facility] [phone] (phone) [phone] (fax)  [place]

## 2022-10-28 ENCOUNTER — Encounter: Payer: Self-pay | Admitting: Family Medicine

## 2022-10-28 ENCOUNTER — Ambulatory Visit: Payer: 59 | Admitting: Family Medicine

## 2022-10-28 VITALS — Wt 174.0 lb

## 2022-10-28 DIAGNOSIS — M791 Myalgia, unspecified site: Secondary | ICD-10-CM | POA: Diagnosis not present

## 2022-10-28 DIAGNOSIS — E1165 Type 2 diabetes mellitus with hyperglycemia: Secondary | ICD-10-CM | POA: Diagnosis not present

## 2022-10-28 NOTE — Assessment & Plan Note (Signed)
Chronic problem  Not improved  Patient reports soreness in both legs extending to calf muscles  Recommended checking CK and LFT in the next week, lab requisition given to patient Discussed that this could be related to statin medication  Will follow up pending labs and next appt

## 2022-10-28 NOTE — Assessment & Plan Note (Addendum)
Last A1c elevated, uncontrolled  A1c 13  Recommended starting insulin therapy for next 3 months  Patient prefers to manage with oral medications since she has been able to restart Jardiance 25mg   Patient will follow up on 11/21/22 for BG check  Recommended that she check fasting and PP glucoses until next appt  She will continue glipizide 10 mg daily, Actos 15 mg daily, Jardiance 25 mg daily Patient is on atorvastatin 40 mg daily and lisinopril 5 mg daily Will repeat A1c in may 2024  Recommended patient schedule appt for DM eye exam  Foot exam completed today

## 2022-10-28 NOTE — Patient Instructions (Addendum)
For your diabetes:   Please check your blood sugar after you have not had anything to eat for 8 hours. This will be your fasting blood glucose. The goal for this is 120 or less.    I would also like for you to check your blood glucose 2 hours after eating.  The goal is for your level to be less than 250 after eating.     Please follow up on 11/21/22 to see how your blood sugar Is adjusting to your medication and we will recheck your A1c in May.    Please schedule to have your diabetes eye exam.   For your sore legs, please come by the lab from 8 AM until 11:30 AM or from 1PM-4:30PM  in the next week to have the labs drawn to check for any muscle breakdown that can sometimes be associated with your cholesterol medications.

## 2022-11-20 NOTE — Progress Notes (Signed)
I,[name],acting as a scribe for [employer], MD.,have documented all relevant documentation on the behalf of [name], MD,as directed by  [name], MD while in the presence of [name], MD.   Established patient visit   Patient: Kelly Manning   DOB: 1956-06-25   66 y.o. Female  MRN: [id number] Visit Date: 11/21/2022  Today's healthcare provider: [name], MD   No chief complaint on file.  Subjective    HPI   Patient states she wants to go back on Kombiglyze XR 2.5-'1000mg'$  tabs.  Hypertension, follow-up  BP Readings from Last 3 Encounters:  11/21/22 122/78  10/21/22 (!) 146/85  07/29/19 115/71   Wt Readings from Last 3 Encounters:  11/21/22 176 lb 3.2 oz (79.9 kg)  10/28/22 174 lb (78.9 kg)  10/21/22 179 lb (81.2 kg)     She was last seen for hypertension 1 month ago.  BP at that visit was 146/85. Management since that visit include restarting lisinopril '5mg'$  daily   She reports fair compliance with treatment.  Outside blood pressures are not being checked Symptoms: No chest pain No chest pressure  No palpitations No syncope  No dyspnea No orthopnea  No paroxysmal nocturnal dyspnea No lower extremity edema   Pertinent labs Lab Results  Component Value Date   CHOL 199 03/02/2019   HDL 59 03/02/2019   LDLCALC 106 (H) 03/02/2019   TRIG 170 (H) 03/02/2019   CHOLHDL 3.4 03/02/2019   Lab Results  Component Value Date   NA 136 11/21/2022   K 4.7 11/21/2022   CREATININE 1.47 (H) 11/21/2022   EGFR 39 (L) 11/21/2022   GLUCOSE 334 (H) 11/21/2022   TSH 1.730 03/02/2019     The ASCVD Risk score (Arnett DK, et al., 2019) failed to calculate for the following reasons:   Cannot find a previous HDL lab  --------------------------------------------------------------------------------------------------- Diabetes Mellitus Type II, Follow-up  Lab Results  Component Value Date    HGBA1C 13.7 (H) 10/21/2022   HGBA1C 13.3 (A) 07/15/2019   HGBA1C  03/02/2019     Comment:     greater than 14.0   Wt Readings from Last 3 Encounters:  11/21/22 176 lb 3.2 oz (79.9 kg)  10/28/22 174 lb (78.9 kg)  10/21/22 179 lb (81.2 kg)   Last seen for diabetes 1 weeks ago.  Management since then includes patient's is at 13.7. I would strongly recommend starting insulin therapy and further adjustment of medications. She has not been on saxagliptin-metformin or jardiance for the last few weeks    Home blood sugar records:  390, was in the 400s   Blood sugars are still high at home.  Episodes of hypoglycemia? No   Current insulin regiment: none She states that she has been eating better She states she has not been able    Medications: Outpatient Medications Prior to Visit  Medication Sig   atorvastatin (LIPITOR) 40 MG tablet Take 1 tablet (40 mg total) by mouth daily.   Blood Glucose Monitoring Suppl (ONE TOUCH ULTRA SYSTEM KIT) w/Device KIT 1 kit by Does not apply route 2 (two) times daily. Test strips only. Dx: E11.8   lisinopril (ZESTRIL) 5 MG tablet Take 1 tablet (5 mg total) by mouth daily.   ONE TOUCH ULTRA TEST test strip 1 each by Other route 2 (two) times daily. for testing   [DISCONTINUED] aspirin 81 MG tablet Take by mouth.   [DISCONTINUED] glipiZIDE (GLUCOTROL) 10 MG tablet Take 1 tablet (10  mg total) by mouth daily.   [DISCONTINUED] JARDIANCE 25 MG TABS tablet Take 25 mg by mouth daily.   [DISCONTINUED] pioglitazone (ACTOS) 15 MG tablet Take 1 tablet (15 mg total) by mouth daily.   [DISCONTINUED] pioglitazone (ACTOS) 30 MG tablet Take 30 mg by mouth daily.   aspirin EC 81 MG tablet Take 81 mg by mouth.   [DISCONTINUED] KOMBIGLYZE XR 2.01-999 MG TB24 Take 2 tablets by mouth every morning. (Patient not taking: Reported on 11/21/2022)   No facility-administered medications prior to visit.    Review of Systems     Objective    BP 122/78 (BP Location: Right Arm,  Patient Position: Sitting, Cuff Size: Normal)   Pulse 75   Temp 98.6 F (37 C) (Oral)   Resp 14   Ht '5\' 6"'$  (1.676 m)   Wt 176 lb 3.2 oz (79.9 kg)   SpO2 100%   BMI 28.44 kg/m    Physical Exam Vitals reviewed.  Constitutional:      General: She is not in acute distress.    Appearance: Normal appearance. She is not ill-appearing, toxic-appearing or diaphoretic.  Eyes:     Conjunctiva/sclera: Conjunctivae normal.  Cardiovascular:     Rate and Rhythm: Normal rate and regular rhythm.     Pulses: Normal pulses.     Heart sounds: Normal heart sounds. No murmur heard.    No friction rub. No gallop.  Pulmonary:     Effort: Pulmonary effort is normal. No respiratory distress.     Breath sounds: Normal breath sounds. No stridor. No wheezing, rhonchi or rales.  Abdominal:     General: Bowel sounds are normal. There is no distension.     Palpations: Abdomen is soft.     Tenderness: There is no abdominal tenderness.  Musculoskeletal:     Right lower leg: No edema.     Left lower leg: No edema.  Skin:    Findings: No erythema or rash.  Neurological:     Mental Status: She is alert and oriented to person, place, and time.       Results for orders placed or performed in visit on 11/21/22  Comprehensive metabolic panel  Result Value Ref Range   Glucose 334 (H) 70 - 99 mg/dL   BUN 36 (H) 8 - 27 mg/dL   Creatinine, Ser 1.47 (H) 0.57 - 1.00 mg/dL   eGFR 39 (L) >59 mL/min/1.73   BUN/Creatinine Ratio 24 12 - 28   Sodium 136 134 - 144 mmol/L   Potassium 4.7 3.5 - 5.2 mmol/L   Chloride 99 96 - 106 mmol/L   CO2 19 (L) 20 - 29 mmol/L   Calcium 9.3 8.7 - 10.3 mg/dL   Total Protein 7.3 6.0 - 8.5 g/dL   Albumin 4.5 3.9 - 4.9 g/dL   Globulin, Total 2.8 1.5 - 4.5 g/dL   Albumin/Globulin Ratio 1.6 1.2 - 2.2   Bilirubin Total 0.2 0.0 - 1.2 mg/dL   Alkaline Phosphatase 126 (H) 44 - 121 IU/L   AST 14 0 - 40 IU/L   ALT 15 0 - 32 IU/L  CK  Result Value Ref Range   Total CK 101 32 - 182 U/L     Assessment & Plan     Problem List Items Addressed This Visit       Cardiovascular and Mediastinum   HYPERTENSION, BENIGN    Chronic  Controlled, at goal today  Continue current medications No changes to regimen today  Relevant Medications   aspirin EC 81 MG tablet     Endocrine   Hyperglycemia due to type 2 diabetes mellitus ([facility]) - Primary    Chronic  Uncontrolled, last A1c was above goal range at 13 Lab Results  Component Value Date   HGBA1C 13.7 (H) 10/21/2022    Patient does not want to start insulin therapy and states she has not been on several agents due to various barriers  Will increase glipizide to '15mg'$  daily  She will continue jardiance '25mg'$  daily Will increase actos to '30mg'$  daily in order to match dosage on patient's old pill bottle  Will start sitagliptin alone instead of combination agent with metformin at '25mg'$  daily        Relevant Medications   aspirin EC 81 MG tablet   JARDIANCE 25 MG TABS tablet   glipiZIDE (GLUCOTROL) 10 MG tablet   pioglitazone (ACTOS) 30 MG tablet   glipiZIDE (GLUCOTROL) 5 MG tablet   SITagliptin 25 MG TABS   Other Relevant Orders   Comprehensive metabolic panel (Completed)   Hyperlipidemia associated with type 2 diabetes mellitus ([facility])    Chronic  On statin medication  Reported myalgias at previous appt, patient never had labs drawn due to concern for cost  Will reorder labs today including CK and CMP       Relevant Medications   aspirin EC 81 MG tablet   JARDIANCE 25 MG TABS tablet   glipiZIDE (GLUCOTROL) 10 MG tablet   pioglitazone (ACTOS) 30 MG tablet   glipiZIDE (GLUCOTROL) 5 MG tablet   SITagliptin 25 MG TABS     Other   Healthcare maintenance    Submitted orders/referral for colonoscopy and mammogram today       Encounter for screening mammogram for malignant neoplasm of breast    Mammogram ordered today  Patient given card to schedule mammogram        Relevant Orders   MM 3D SCREEN BREAST  BILATERAL   Screening for colon cancer    Referral to GI for colonoscopy submitted today       Relevant Orders   Ambulatory referral to Gastroenterology     Return in about 1 month (around 12/20/2022) for diabetes meds.        The entirety of the information documented in the History of Present Illness, Review of Systems and Physical Exam were personally obtained by me. Portions of this information were initially documented by Lyndel Pleasure, CMA . I, [name], MD have reviewed the documentation above for thoroughness and accuracy.   [name], MD  [facility] [phone] (phone) [phone] (fax)  [place]

## 2022-11-21 ENCOUNTER — Ambulatory Visit: Payer: 59 | Admitting: Family Medicine

## 2022-11-21 ENCOUNTER — Encounter: Payer: Self-pay | Admitting: Family Medicine

## 2022-11-21 VITALS — BP 122/78 | HR 75 | Temp 98.6°F | Resp 14 | Ht 66.0 in | Wt 176.2 lb

## 2022-11-21 DIAGNOSIS — E1165 Type 2 diabetes mellitus with hyperglycemia: Secondary | ICD-10-CM

## 2022-11-21 DIAGNOSIS — E1169 Type 2 diabetes mellitus with other specified complication: Secondary | ICD-10-CM

## 2022-11-21 DIAGNOSIS — Z1211 Encounter for screening for malignant neoplasm of colon: Secondary | ICD-10-CM

## 2022-11-21 DIAGNOSIS — M791 Myalgia, unspecified site: Secondary | ICD-10-CM

## 2022-11-21 DIAGNOSIS — Z1231 Encounter for screening mammogram for malignant neoplasm of breast: Secondary | ICD-10-CM

## 2022-11-21 DIAGNOSIS — I25119 Atherosclerotic heart disease of native coronary artery with unspecified angina pectoris: Secondary | ICD-10-CM

## 2022-11-21 DIAGNOSIS — E785 Hyperlipidemia, unspecified: Secondary | ICD-10-CM

## 2022-11-21 DIAGNOSIS — I10 Essential (primary) hypertension: Secondary | ICD-10-CM

## 2022-11-21 DIAGNOSIS — Z0000 Encounter for general adult medical examination without abnormal findings: Secondary | ICD-10-CM

## 2022-11-21 MED ORDER — JARDIANCE 25 MG PO TABS: 25.0000 mg | ORAL_TABLET | Freq: Every day | ORAL | 1 refills | Status: DC

## 2022-11-21 MED ORDER — PIOGLITAZONE HCL 30 MG PO TABS: 30.0000 mg | ORAL_TABLET | Freq: Every day | ORAL | 1 refills | Status: DC

## 2022-11-21 MED ORDER — GLIPIZIDE 10 MG PO TABS: 10.0000 mg | ORAL_TABLET | Freq: Every day | ORAL | 1 refills | Status: DC

## 2022-11-21 MED ORDER — GLIPIZIDE 5 MG PO TABS: 5.0000 mg | ORAL_TABLET | Freq: Two times a day (BID) | ORAL | 1 refills | Status: DC

## 2022-11-21 MED ORDER — SITAGLIPTIN 25 MG PO TABS: 25.0000 mg | ORAL_TABLET | Freq: Every day | ORAL | 1 refills | Status: DC

## 2022-11-21 NOTE — Patient Instructions (Addendum)
We will check you kidney and liver function and look for any muscle protein changes that could be related to sore muscles today and follow up once the results are available   We have adjusted your medications to include:  1.Glipizide '15mg'$  daily (combine '10mg'$  and '5mg'$ )  2. Jardiance '25mg'$  daily  3. Sitagliptin '25mg'$  daily  4. Actos '30mg'$  daily    Please follow up in 1 month for medication management

## 2022-11-22 ENCOUNTER — Other Ambulatory Visit: Payer: Self-pay | Admitting: Family Medicine

## 2022-11-22 DIAGNOSIS — E1122 Type 2 diabetes mellitus with diabetic chronic kidney disease: Secondary | ICD-10-CM

## 2022-11-22 LAB — COMPREHENSIVE METABOLIC PANEL
ALT: 15 IU/L (ref 0–32)
AST: 14 IU/L (ref 0–40)
Albumin/Globulin Ratio: 1.6 (ref 1.2–2.2)
Albumin: 4.5 g/dL (ref 3.9–4.9)
Alkaline Phosphatase: 126 IU/L — ABNORMAL HIGH (ref 44–121)
BUN/Creatinine Ratio: 24 (ref 12–28)
BUN: 36 mg/dL — ABNORMAL HIGH (ref 8–27)
Bilirubin Total: 0.2 mg/dL (ref 0.0–1.2)
CO2: 19 mmol/L — ABNORMAL LOW (ref 20–29)
Calcium: 9.3 mg/dL (ref 8.7–10.3)
Chloride: 99 mmol/L (ref 96–106)
Creatinine, Ser: 1.47 mg/dL — ABNORMAL HIGH (ref 0.57–1.00)
Globulin, Total: 2.8 g/dL (ref 1.5–4.5)
Glucose: 334 mg/dL — ABNORMAL HIGH (ref 70–99)
Potassium: 4.7 mmol/L (ref 3.5–5.2)
Sodium: 136 mmol/L (ref 134–144)
Total Protein: 7.3 g/dL (ref 6.0–8.5)
eGFR: 39 mL/min/{1.73_m2} — ABNORMAL LOW (ref >59)

## 2022-11-22 LAB — CK: Total CK: 101 U/L (ref 32–182)

## 2022-11-22 NOTE — Assessment & Plan Note (Signed)
Submitted orders/referral for colonoscopy and mammogram today

## 2022-11-22 NOTE — Assessment & Plan Note (Signed)
Chronic  Controlled, at goal today  Continue current medications No changes to regimen today

## 2022-11-22 NOTE — Assessment & Plan Note (Signed)
Mammogram ordered today  Patient given card to schedule mammogram  

## 2022-11-22 NOTE — Assessment & Plan Note (Signed)
Chronic  On statin medication  Reported myalgias at previous appt, patient never had labs drawn due to concern for cost  Will reorder labs today including CK and CMP

## 2022-11-22 NOTE — Assessment & Plan Note (Signed)
Chronic  Uncontrolled, last A1c was above goal range at 13 Lab Results  Component Value Date   HGBA1C 13.7 (H) 10/21/2022    Patient does not want to start insulin therapy and states she has not been on several agents due to various barriers  Will increase glipizide to '15mg'$  daily  She will continue jardiance '25mg'$  daily Will increase actos to '30mg'$  daily in order to match dosage on patient's old pill bottle  Will start sitagliptin alone instead of combination agent with metformin at '25mg'$  daily

## 2022-11-22 NOTE — Assessment & Plan Note (Signed)
Referral to GI for colonoscopy submitted today

## 2022-11-27 ENCOUNTER — Telehealth: Payer: Self-pay | Admitting: Family Medicine

## 2022-11-27 ENCOUNTER — Other Ambulatory Visit: Payer: Self-pay

## 2022-11-27 DIAGNOSIS — E1165 Type 2 diabetes mellitus with hyperglycemia: Secondary | ICD-10-CM

## 2022-11-27 MED ORDER — SITAGLIPTIN 25 MG PO TABS: 25.0000 mg | ORAL_TABLET | Freq: Every day | ORAL | 1 refills | Status: DC

## 2022-11-27 NOTE — Telephone Encounter (Signed)
Please call patient to clarify medication regimen for diabetes.   Received request from pharmacy for kombiglyze XR 2.5-'1000mg'$ .  This will not be refilled as we discontinued this during her last visit.   Kombiglyze (Saxagliptin / Metformin) XR 2.5-'1000mg'$  was discontinued in order to stop her metformin due to changes in kidney function.   She was prescribed sitagliptin '25mg'$  instead.   She should be taking the following for diabetes:   - glipizide '15mg'$  ('10mg'$ +'5mg'$ ) - jardiance '25mg'$   - sitagliptin '25mg'$   - actos '30mg'$     [name], MD  [facility]

## 2022-11-27 NOTE — Telephone Encounter (Signed)
Medication Refill - Medication: KOMBIGLYZE XR 2.01-999 MG TB24   Has the patient contacted their pharmacy? Yes.   (Agent: If no, request that the patient contact the pharmacy for the refill. If patient does not wish to contact the pharmacy document the reason why and proceed with request.) (Agent: If yes, when and what did the pharmacy advise?)  Preferred Pharmacy (with phone number or street name):  [place], [place] Phone: [phone]  Fax: [phone]     Has the patient been seen for an appointment in the last year OR does the patient have an upcoming appointment? Yes.    Agent: Please be advised that RX refills may take up to 3 business days. We ask that you follow-up with your pharmacy.

## 2022-11-27 NOTE — Telephone Encounter (Signed)
Patient advised and verbalized understanding 

## 2022-12-05 ENCOUNTER — Other Ambulatory Visit: Payer: Self-pay | Admitting: Nephrology

## 2022-12-05 DIAGNOSIS — E1122 Type 2 diabetes mellitus with diabetic chronic kidney disease: Secondary | ICD-10-CM

## 2022-12-05 DIAGNOSIS — N1832 Chronic kidney disease, stage 3b: Secondary | ICD-10-CM

## 2022-12-17 ENCOUNTER — Ambulatory Visit
Admission: RE | Admit: 2022-12-17 | Discharge: 2022-12-17 | Disposition: A | Discharge status: Home / Self Care | Payer: 59 | Source: Ambulatory Visit | Attending: Nephrology | Admitting: Nephrology

## 2022-12-17 DIAGNOSIS — N1832 Chronic kidney disease, stage 3b: Secondary | ICD-10-CM | POA: Diagnosis present

## 2022-12-17 DIAGNOSIS — E1122 Type 2 diabetes mellitus with diabetic chronic kidney disease: Secondary | ICD-10-CM | POA: Diagnosis not present

## 2022-12-23 NOTE — Progress Notes (Unsigned)
I,[name],acting as a scribe for [employer], MD.,have documented all relevant documentation on the behalf of [name], MD,as directed by  [name], MD while in the presence of [name], MD.   Established patient visit   Patient: Kelly Manning   DOB: February 27, 1956   66 y.o. Female  MRN: [id number]  Visit Date: 12/25/2022  Today's healthcare provider: [name], MD   Chief Complaint  Patient presents with   follow-up DM   Subjective    HPI   Diabetes Mellitus Type II, Follow-up  Lab Results  Component Value Date   HGBA1C 13.7 (H) 10/21/2022   HGBA1C 13.3 (A) 07/15/2019   HGBA1C  03/02/2019     Comment:     greater than 14.0   Wt Readings from Last 3 Encounters:  12/25/22 178 lb 9.6 oz (81 kg)  11/21/22 176 lb 3.2 oz (79.9 kg)  10/28/22 174 lb (78.9 kg)   Last seen for diabetes 1 months ago.  Management since then includes  increase glipizide to 15mg  daily ,continue jardiance 25mg  daily Will increase actos to 30mg  daily in order to match dosage on patient's old pill bottle  Will start sitagliptin alone instead of combination agent with metformin at 25mg  daily. Patient reports not able to get Sitagliptin -cost of medication.  She reports excellent compliance with treatment.  Symptoms: No fatigue No foot ulcerations  No appetite changes No nausea  No paresthesia of the feet  No polydipsia  No polyuria No visual disturbances   No vomiting     Home blood sugar records:  not being checked   Current insulin regiment: none  Eye Exam: Per patient [place] doesn't get her insurance. She is waiting on her insurance letter to let her know which doctors are in Network.   States that she has been recommended to be on strict diet from her nephrologist   Pertinent Labs: Lab Results  Component Value Date   CHOL 199 03/02/2019   HDL 59 03/02/2019   LDLCALC 106 (H) 03/02/2019   TRIG 170  (H) 03/02/2019   CHOLHDL 3.4 03/02/2019   Lab Results  Component Value Date   NA 136 11/21/2022   K 4.7 11/21/2022   CREATININE 1.47 (H) 11/21/2022   EGFR 39 (L) 11/21/2022   LABMICR 84.0 10/21/2022   MICRALBCREAT 242 (H) 10/21/2022     ---------------------------------------------------------------------------------------------------   Medications: Outpatient Medications Prior to Visit  Medication Sig   aspirin EC 81 MG tablet Take 81 mg by mouth.   atorvastatin (LIPITOR) 40 MG tablet Take 1 tablet (40 mg total) by mouth daily.   glipiZIDE (GLUCOTROL) 10 MG tablet Take 1 tablet (10 mg total) by mouth daily.   glipiZIDE (GLUCOTROL) 5 MG tablet Take 1 tablet (5 mg total) by mouth 2 (two) times daily before a meal.   JARDIANCE 25 MG TABS tablet Take 1 tablet (25 mg total) by mouth daily.   lisinopril (ZESTRIL) 5 MG tablet Take 1 tablet (5 mg total) by mouth daily.   ONE TOUCH ULTRA TEST test strip 1 each by Other route 2 (two) times daily. for testing   pioglitazone (ACTOS) 30 MG tablet Take 1 tablet (30 mg total) by mouth daily.   [DISCONTINUED] Blood Glucose Monitoring Suppl (ONE TOUCH ULTRA SYSTEM KIT) w/Device KIT 1 kit by Does not apply route 2 (two) times daily. Test strips only. Dx: E11.8   [DISCONTINUED] SITagliptin 25 MG TABS Take 25 mg by mouth daily.  No facility-administered medications prior to visit.    Review of Systems     Objective    BP 118/78 (BP Location: Left Arm, Patient Position: Sitting, Cuff Size: Normal)   Pulse 80   Temp 98 F (36.7 C) (Oral)   Resp 16   Wt 178 lb 9.6 oz (81 kg)   BMI 28.83 kg/m    Physical Exam Vitals reviewed.  Constitutional:      General: She is not in acute distress.    Appearance: Normal appearance. She is not ill-appearing, toxic-appearing or diaphoretic.  Eyes:     Conjunctiva/sclera: Conjunctivae normal.  Neck:     Thyroid: No thyroid mass, thyromegaly or thyroid tenderness.     Vascular: No carotid bruit.   Cardiovascular:     Rate and Rhythm: Normal rate and regular rhythm.     Pulses: Normal pulses.     Heart sounds: Normal heart sounds. No murmur heard.    No friction rub. No gallop.  Pulmonary:     Effort: Pulmonary effort is normal. No respiratory distress.     Breath sounds: Normal breath sounds. No stridor. No wheezing, rhonchi or rales.  Abdominal:     General: Bowel sounds are normal. There is no distension.     Palpations: Abdomen is soft.     Tenderness: There is no abdominal tenderness.  Musculoskeletal:     Right lower leg: No edema.     Left lower leg: No edema.  Lymphadenopathy:     Cervical: No cervical adenopathy.  Skin:    Findings: No erythema or rash.  Neurological:     Mental Status: She is alert and oriented to person, place, and time.       No results found for any visits on 12/25/22.  Assessment & Plan     Problem List Items Addressed This Visit       Endocrine   Hyperglycemia due to type 2 diabetes mellitus - Primary    Chronic  Uncontrolled DM  Replacement glucose meter with supplies submitted to pharmacy today, patient reports meter is not allowing her to insert testing strip normally  She will continue glipizide 15mg , jardiance 25mg  and actos 30mg  daily  She is working on scheduling her DM eye exam pending insurance coverage  Foot exam completed today No medication changes today, patient would like to focus on improving with her new diet recommended to her by her nephrologist  We revisited discussion of insulin, patient would prefer to hold off for now  Will plan to recheck A1c in May         Return in about 6 weeks (around 02/05/2023) for diabetes .        The entirety of the information documented in the History of Present Illness, Review of Systems and Physical Exam were personally obtained by me. Portions of this information were initially documented by Lyndel Pleasure, CMA . I, [name], MD have reviewed the  documentation above for thoroughness and accuracy.      [name], MD  [facility] [phone] (phone) [phone] (fax)  [place]

## 2022-12-25 ENCOUNTER — Encounter: Payer: Self-pay | Admitting: Family Medicine

## 2022-12-25 ENCOUNTER — Ambulatory Visit: Payer: 59 | Admitting: Family Medicine

## 2022-12-25 VITALS — BP 118/78 | HR 80 | Temp 98.0°F | Resp 16 | Wt 178.6 lb

## 2022-12-25 DIAGNOSIS — E1165 Type 2 diabetes mellitus with hyperglycemia: Secondary | ICD-10-CM

## 2022-12-25 MED ORDER — LANCET DEVICE MISC: 1.0000 | Freq: Three times a day (TID) | 0 refills | Status: AC

## 2022-12-25 MED ORDER — BLOOD GLUCOSE TEST VI STRP: 1.0000 | ORAL_STRIP | Freq: Three times a day (TID) | 0 refills | Status: AC

## 2022-12-25 MED ORDER — BLOOD GLUCOSE MONITORING SUPPL DEVI: 1.0000 | Freq: Three times a day (TID) | 0 refills | Status: AC

## 2022-12-25 MED ORDER — LANCETS MISC. MISC: 1.0000 | Freq: Three times a day (TID) | 0 refills | Status: AC

## 2022-12-25 NOTE — Assessment & Plan Note (Signed)
Chronic  Uncontrolled DM  Replacement glucose meter with supplies submitted to pharmacy today, patient reports meter is not allowing her to insert testing strip normally  She will continue glipizide 15mg , jardiance 25mg  and actos 30mg  daily  She is working on scheduling her DM eye exam pending insurance coverage  Foot exam completed today No medication changes today, patient would like to focus on improving with her new diet recommended to her by her nephrologist  We revisited discussion of insulin, patient would prefer to hold off for now  Will plan to recheck A1c in May

## 2022-12-25 NOTE — Patient Instructions (Signed)
It was a pleasure to see you today!  Thank you for choosing [facility] for your primary care.   Kelly Manning was seen for diabetes follow up.   Our plans for today were: Continue your current diabetes medications   To keep you healthy, please keep in mind the following health maintenance items that you are due for:   Colonoscopy  Eye exam  Pap smear    You should return to our clinic 02/05/23 for hemoglobin A1c   Best Wishes,   Dr. Quentin Cornwall

## 2022-12-27 ENCOUNTER — Ambulatory Visit
Admission: RE | Admit: 2022-12-27 | Discharge: 2022-12-27 | Disposition: A | Discharge status: Home / Self Care | Payer: 59 | Source: Ambulatory Visit | Attending: Family Medicine | Admitting: Family Medicine

## 2022-12-27 DIAGNOSIS — Z1231 Encounter for screening mammogram for malignant neoplasm of breast: Secondary | ICD-10-CM | POA: Diagnosis present

## 2022-12-31 ENCOUNTER — Inpatient Hospital Stay
Admission: RE | Admit: 2022-12-31 | Discharge: 2022-12-31 | Disposition: A | Discharge status: Home / Self Care | Payer: Self-pay | Source: Ambulatory Visit | Attending: Family Medicine | Admitting: Family Medicine

## 2022-12-31 ENCOUNTER — Other Ambulatory Visit: Payer: Self-pay | Admitting: *Deleted

## 2022-12-31 DIAGNOSIS — Z1231 Encounter for screening mammogram for malignant neoplasm of breast: Secondary | ICD-10-CM

## 2023-01-28 ENCOUNTER — Ambulatory Visit: Payer: 59 | Admitting: Family Medicine

## 2023-02-04 NOTE — Progress Notes (Deleted)
      Established patient visit   Patient: Kelly Manning   DOB: Jul 13, 1956   67 y.o. Female  MRN: [id number] Visit Date: 02/05/2023  Today's healthcare provider: Ronnald Ramp, MD   No chief complaint on file.  Subjective    HPI  Diabetes Mellitus Type II, Follow-up  Lab Results  Component Value Date   HGBA1C 13.7 (H) 10/21/2022   HGBA1C 13.3 (A) 07/15/2019   HGBA1C  03/02/2019     Comment:     greater than 14.0   Wt Readings from Last 3 Encounters:  12/25/22 178 lb 9.6 oz (81 kg)  11/21/22 176 lb 3.2 oz (79.9 kg)  10/28/22 174 lb (78.9 kg)   Last seen for diabetes 6 weeks ago.  Management since then includes continue glipizide 15mg , jardiance 25mg  and actos 30mg  daily. She reports {excellent/good/fair/poor:19665} compliance with treatment.  Symptoms: {Yes/No:20286} fatigue {Yes/No:20286} foot ulcerations  {Yes/No:20286} appetite changes {Yes/No:20286} nausea  {Yes/No:20286} paresthesia of the feet  {Yes/No:20286} polydipsia  {Yes/No:20286} polyuria {Yes/No:20286} visual disturbances   {Yes/No:20286} vomiting     Home blood sugar records: {diabetes glucometry results:16657}  Episodes of hypoglycemia? {Yes/No:20286} {enter symptoms and frequency of symptoms if yes:1}   Current insulin regiment: none Most Recent Eye Exam: ***   Pertinent Labs: Lab Results  Component Value Date   CHOL 199 03/02/2019   HDL 59 03/02/2019   LDLCALC 106 (H) 03/02/2019   TRIG 170 (H) 03/02/2019   CHOLHDL 3.4 03/02/2019   Lab Results  Component Value Date   NA 136 11/21/2022   K 4.7 11/21/2022   CREATININE 1.47 (H) 11/21/2022   EGFR 39 (L) 11/21/2022   LABMICR 84.0 10/21/2022   MICRALBCREAT 242 (H) 10/21/2022     ---------------------------------------------------------------------------------------------------   Medications: Outpatient Medications Prior to Visit  Medication Sig   aspirin EC 81 MG tablet Take 81 mg by mouth.   atorvastatin (LIPITOR) 40 MG  tablet Take 1 tablet (40 mg total) by mouth daily.   Blood Glucose Monitoring Suppl DEVI 1 each by Does not apply route in the morning, at noon, and at bedtime. May substitute to any manufacturer covered by patient's insurance.   glipiZIDE (GLUCOTROL) 10 MG tablet Take 1 tablet (10 mg total) by mouth daily.   glipiZIDE (GLUCOTROL) 5 MG tablet Take 1 tablet (5 mg total) by mouth 2 (two) times daily before a meal.   JARDIANCE 25 MG TABS tablet Take 1 tablet (25 mg total) by mouth daily.   lisinopril (ZESTRIL) 5 MG tablet Take 1 tablet (5 mg total) by mouth daily.   ONE TOUCH ULTRA TEST test strip 1 each by Other route 2 (two) times daily. for testing   pioglitazone (ACTOS) 30 MG tablet Take 1 tablet (30 mg total) by mouth daily.   No facility-administered medications prior to visit.    Review of Systems  {Labs  Heme  Chem  Endocrine  Serology  Results Review (optional):23779}   Objective    There were no vitals taken for this visit. {Show previous vital signs (optional):23777}  Physical Exam  ***  No results found for any visits on 02/05/23.  Assessment & Plan     ***  No follow-ups on file.      {provider attestation***:1}   Ronnald Ramp, MD  [facility] [phone] (phone) [phone] (fax)  [facility]

## 2023-02-05 ENCOUNTER — Ambulatory Visit: Payer: 59 | Admitting: Family Medicine

## 2023-03-03 NOTE — Progress Notes (Deleted)
Established patient visit   Patient: Kelly Manning   DOB: 1956/01/24   67 y.o. Female  MRN: [id number] Visit Date: 03/04/2023  Today's healthcare provider: Ronnald Ramp, MD   No chief complaint on file.  Subjective    HPI   Diabetes Mellitus Type II, Follow-up  Lab Results  Component Value Date   HGBA1C 13.7 (H) 10/21/2022   HGBA1C 13.3 (A) 07/15/2019   HGBA1C  03/02/2019     Comment:     greater than 14.0   Wt Readings from Last 3 Encounters:  12/25/22 178 lb 9.6 oz (81 kg)  11/21/22 176 lb 3.2 oz (79.9 kg)  10/28/22 174 lb (78.9 kg)   Last seen for diabetes 3 months ago.  Management since then includes continue glipizide 15 mg daily, Jardiance 25 mg daily and Actos 30 mg daily. She reports {excellent/good/fair/poor:19665} compliance with treatment. She {is/is not:21021397} having side effects. {document side effects if present:1} Diabetes is complicated by CKD stage 3A, last creatinine was 1.4, she was last evaluated by her nephrologist on 02/11/23.   Symptoms: {Yes/No:20286} fatigue {Yes/No:20286} foot ulcerations  {Yes/No:20286} appetite changes {Yes/No:20286} nausea  {Yes/No:20286} paresthesia of the feet  {Yes/No:20286} polydipsia  {Yes/No:20286} polyuria {Yes/No:20286} visual disturbances   {Yes/No:20286} vomiting     Home blood sugar records: {diabetes glucometry results:16657}  Episodes of hypoglycemia? {Yes/No:20286} {enter symptoms and frequency of symptoms if yes:1}   Current insulin regiment: {enter 'none' or type of insulin and number of units taken with each dose of each insulin formulation that the patient is taking:1} Most Recent Eye Exam: *** {Current exercise:16438:::1} {Current diet habits:16563:::1}  Pertinent Labs: Lab Results  Component Value Date   CHOL 199 03/02/2019   HDL 59 03/02/2019   LDLCALC 106 (H) 03/02/2019   TRIG 170 (H) 03/02/2019   CHOLHDL 3.4 03/02/2019   Lab Results  Component Value Date   NA 136  11/21/2022   K 4.7 11/21/2022   CREATININE 1.47 (H) 11/21/2022   EGFR 39 (L) 11/21/2022   LABMICR 84.0 10/21/2022   MICRALBCREAT 242 (H) 10/21/2022     ---------------------------------------------------------------------------------------------------   Medications: Outpatient Medications Prior to Visit  Medication Sig   aspirin EC 81 MG tablet Take 81 mg by mouth.   atorvastatin (LIPITOR) 40 MG tablet Take 1 tablet (40 mg total) by mouth daily.   Blood Glucose Monitoring Suppl DEVI 1 each by Does not apply route in the morning, at noon, and at bedtime. May substitute to any manufacturer covered by patient's insurance.   glipiZIDE (GLUCOTROL) 10 MG tablet Take 1 tablet (10 mg total) by mouth daily.   glipiZIDE (GLUCOTROL) 5 MG tablet Take 1 tablet (5 mg total) by mouth 2 (two) times daily before a meal.   JARDIANCE 25 MG TABS tablet Take 1 tablet (25 mg total) by mouth daily.   lisinopril (ZESTRIL) 5 MG tablet Take 1 tablet (5 mg total) by mouth daily.   ONE TOUCH ULTRA TEST test strip 1 each by Other route 2 (two) times daily. for testing   pioglitazone (ACTOS) 30 MG tablet Take 1 tablet (30 mg total) by mouth daily.   No facility-administered medications prior to visit.    Review of Systems  {Labs  Heme  Chem  Endocrine  Serology  Results Review (optional):23779}   Objective    There were no vitals taken for this visit. {Show previous vital signs (optional):23777}  Physical Exam  ***  No results found for any visits on 03/04/23.  Assessment & Plan     Problem List Items Addressed This Visit       Cardiovascular and Mediastinum   HYPERTENSION, BENIGN     Endocrine   Hyperglycemia due to type 2 diabetes mellitus (HCC) - Primary   Hyperlipidemia associated with type 2 diabetes mellitus (HCC)     No follow-ups on file.      {provider attestation***:1}   Ronnald Ramp, MD  [facility] [phone]  (phone) [phone] (fax)  [facility]

## 2023-03-04 ENCOUNTER — Ambulatory Visit: Payer: 59 | Admitting: Family Medicine

## 2023-03-06 ENCOUNTER — Other Ambulatory Visit: Payer: Self-pay | Admitting: Family Medicine

## 2023-03-06 DIAGNOSIS — I10 Essential (primary) hypertension: Secondary | ICD-10-CM

## 2023-05-11 ENCOUNTER — Other Ambulatory Visit: Payer: Self-pay

## 2023-05-11 ENCOUNTER — Emergency Department: Payer: 59

## 2023-05-11 ENCOUNTER — Emergency Department
Admission: EM | Admit: 2023-05-11 | Discharge: 2023-05-11 | Disposition: A | Discharge status: Home / Self Care | Payer: 59 | Attending: Emergency Medicine | Admitting: Emergency Medicine

## 2023-05-11 ENCOUNTER — Other Ambulatory Visit: Payer: Self-pay | Admitting: Family Medicine

## 2023-05-11 DIAGNOSIS — W1839XA Other fall on same level, initial encounter: Secondary | ICD-10-CM | POA: Insufficient documentation

## 2023-05-11 DIAGNOSIS — S8001XA Contusion of right knee, initial encounter: Secondary | ICD-10-CM | POA: Diagnosis not present

## 2023-05-11 DIAGNOSIS — I10 Essential (primary) hypertension: Secondary | ICD-10-CM | POA: Diagnosis not present

## 2023-05-11 DIAGNOSIS — E119 Type 2 diabetes mellitus without complications: Secondary | ICD-10-CM | POA: Diagnosis not present

## 2023-05-11 DIAGNOSIS — S93511A Sprain of interphalangeal joint of right great toe, initial encounter: Secondary | ICD-10-CM | POA: Diagnosis not present

## 2023-05-11 DIAGNOSIS — M5416 Radiculopathy, lumbar region: Secondary | ICD-10-CM | POA: Insufficient documentation

## 2023-05-11 DIAGNOSIS — E1165 Type 2 diabetes mellitus with hyperglycemia: Secondary | ICD-10-CM

## 2023-05-11 DIAGNOSIS — M79604 Pain in right leg: Secondary | ICD-10-CM | POA: Diagnosis present

## 2023-05-11 MED ORDER — METHYLPREDNISOLONE 4 MG PO TBPK: ORAL_TABLET | ORAL | 0 refills | Status: DC

## 2023-05-11 NOTE — ED Triage Notes (Signed)
Pt to ED POV from home. Hx DM, states yesterday her R leg "gave out" on her 2 times and she fell onto R knee first time, second time did not fall all the way. States that with first fall, also her R great toe bent. Toe appears slightly reddened. Pt complains of pain to R thigh, R great toe and medial R calf. Pt is ambulatory.

## 2023-05-11 NOTE — ED Provider Notes (Signed)
[facility] Provider Note    Event Date/Time   First MD Initiated Contact with Patient 05/11/23 1322     (approximate)   History   Leg Pain and Toe Pain   HPI  Kelly Manning is a 67 y.o. female history of hypertension, hyperlipidemia, arterial stents in the lower extremities, diabetes presents emergency department with right leg pain.  Patient states the leg gives way and she starts to fall.  Pain radiates from the upper thigh to the knee.  States its on the inner area of her thigh.  Right knee is painful as she when she fell she landed on her knee, also bit her great toe back.  No head injury.  No numbness or tingling      Physical Exam   Triage Vital Signs: ED Triage Vitals  Encounter Vitals Group     BP 05/11/23 1214 (!) 166/79     Systolic BP Percentile --      Diastolic BP Percentile --      Pulse Rate 05/11/23 1214 85     Resp 05/11/23 1214 16     Temp 05/11/23 1214 98.6 F (37 C)     Temp Source 05/11/23 1214 Oral     SpO2 05/11/23 1214 94 %     Weight 05/11/23 1214 165 lb (74.8 kg)     Height 05/11/23 1214 5\' 6"  (1.676 m)     Head Circumference --      Peak Flow --      Pain Score 05/11/23 1213 9     Pain Loc --      Pain Education --      Exclude from Growth Chart --     Most recent vital signs: Vitals:   05/11/23 1214  BP: (!) 166/79  Pulse: 85  Resp: 16  Temp: 98.6 F (37 C)  SpO2: 94%     General: Awake, no distress.   CV:  Good peripheral perfusion. regular rate and  rhythm Resp:  Normal effort.  Abd:  No distention.   Other:  Right thigh is tender along the great saphenous vein, some swelling noted, right knee is tender and swelling at the joint, right great toes tender and swollen, neurovascular tact   ED Results / Procedures / Treatments   Labs (all labs ordered are listed, but only abnormal results are displayed) Labs Reviewed - No data to display   EKG     RADIOLOGY X-ray right knee, right great  toe, ultrasound right lower extremity DVT    PROCEDURES:   Procedures   MEDICATIONS ORDERED IN ED: Medications - No data to display   IMPRESSION / MDM / ASSESSMENT AND PLAN / ED COURSE  I reviewed the triage vital signs and the nursing notes.                              Differential diagnosis includes, but is not limited to, DVT, contusion, fracture, effusion  Patient's presentation is most consistent with acute illness / injury with system symptoms.   X-ray of the right knee and right great toe, ultrasound right lower extremity for DVT   X-ray of the right knee and right great toe independently reviewed interpreted by me as being negative for acute abnormality  Did independently review and interpret the radiologist reading for ultrasound of the right lower leg for DVT as being negative for any acute abnormality  Feel that  some of the patient's pain could actually be more of a lumbar radiculopathy.  She has been told in the past she has sciatica.  Feel that she should follow-up with her regular doctor to have a MRI especially if she continues to have more falls.  I did watch the patient ambulates she does not have foot drop.  She does not complain of back pain.  However we will do a trial of a Medrol Dosepak to see if this helps decrease some of the pain she is having radiating down the leg.  She is in agreement treatment plan.  I did give her a knee brace from her previous fall.  She is to apply ice to the knee and the right great toe.  Discharged in stable condition.   FINAL CLINICAL IMPRESSION(S) / ED DIAGNOSES   Final diagnoses:  Contusion of right knee, initial encounter  Lumbar radiculopathy  Sprain of interphalangeal joint of right great toe, initial encounter     Rx / DC Orders   ED Discharge Orders          Ordered    methylPREDNISolone (MEDROL DOSEPAK) 4 MG TBPK tablet        05/11/23 1542             Note:  This document was prepared using Dragon  voice recognition software and may include unintentional dictation errors.    Faythe Ghee, PA-C 05/11/23 1550    Janith Lima, MD 05/12/23 1235

## 2023-06-27 ENCOUNTER — Other Ambulatory Visit: Payer: Self-pay | Admitting: Family Medicine

## 2023-06-27 DIAGNOSIS — Z1212 Encounter for screening for malignant neoplasm of rectum: Secondary | ICD-10-CM

## 2023-06-27 DIAGNOSIS — Z1211 Encounter for screening for malignant neoplasm of colon: Secondary | ICD-10-CM

## 2023-08-25 ENCOUNTER — Other Ambulatory Visit: Payer: Self-pay | Admitting: Family Medicine

## 2023-08-25 DIAGNOSIS — E782 Mixed hyperlipidemia: Secondary | ICD-10-CM

## 2023-08-28 ENCOUNTER — Ambulatory Visit: Payer: 59 | Admitting: Family Medicine

## 2023-08-28 ENCOUNTER — Encounter: Payer: Self-pay | Admitting: Family Medicine

## 2023-08-28 VITALS — BP 139/81 | HR 93 | Resp 16 | Ht 66.0 in | Wt 176.0 lb

## 2023-08-28 DIAGNOSIS — E1169 Type 2 diabetes mellitus with other specified complication: Secondary | ICD-10-CM

## 2023-08-28 DIAGNOSIS — I10 Essential (primary) hypertension: Secondary | ICD-10-CM | POA: Diagnosis not present

## 2023-08-28 DIAGNOSIS — E1165 Type 2 diabetes mellitus with hyperglycemia: Secondary | ICD-10-CM | POA: Diagnosis not present

## 2023-08-28 DIAGNOSIS — E785 Hyperlipidemia, unspecified: Secondary | ICD-10-CM | POA: Diagnosis not present

## 2023-08-28 DIAGNOSIS — Z7984 Long term (current) use of oral hypoglycemic drugs: Secondary | ICD-10-CM

## 2023-08-28 LAB — POCT GLYCOSYLATED HEMOGLOBIN (HGB A1C): Hemoglobin A1C: 11.6 % — AB (ref 4.0–5.6)

## 2023-08-28 MED ORDER — RYBELSUS 3 MG PO TABS
3.0000 mg | ORAL_TABLET | Freq: Every day | ORAL | Status: DC
Start: 2023-08-28 — End: 2023-10-13

## 2023-08-28 NOTE — Progress Notes (Signed)
Established patient visit   Patient: Kelly Manning   DOB: Jun 25, 1956   67 y.o. Female  MRN: [id number] Visit Date: 08/28/2023  Today's healthcare provider: Ronnald Ramp, MD   Chief Complaint  Patient presents with   Diabetes   Hypertension   Subjective       Discussed the use of AI scribe software for clinical note transcription with the patient, who gave verbal consent to proceed.  History of Present Illness   The patient is a 67 year old female with a history of uncontrolled type 2 diabetes, presenting for diabetes management. The patient's most recent A1c was 13, and today's point of care A1c is 11.6, indicating a slight improvement but still far from the goal of less than 7. The patient's current regimen includes Jardiance 25mg , Glipizide 15mg , Lisinopril 5mg , Actos 30mg , and Atorvastatin 40mg .  The patient reports a recent period of deep depression, during which she discontinued all medications for four days. This was reportedly due to a combination of job loss, financial stress, and physical discomfort, particularly in the legs. The patient describes the leg discomfort as burning, tingling, and sticking sensations, which were so severe that she could not tolerate wearing jeans or having anything touch her legs. The patient has since resumed taking Glipizide and plans to gradually reintroduce the other medications.  The patient also reports a series of falls, the most recent of which resulted in swelling and soreness in the knee. She was given a steroid pack for five days, which seemed to help. The patient also reports a history of normal colonoscopies and has received a Cologuard kit for at-home stool sampling.  The patient acknowledges the need for better diabetes control and has made dietary changes, including cutting out sodas. However, she admits to lapses in this regimen during periods of depression. The patient expresses a desire to identify which  medication might be causing the leg discomfort and is open to adjusting her regimen accordingly.       Flowsheet Row Office Visit from 08/28/2023 in [facility]  PHQ-9 Total Score 7        Past Medical History:  Diagnosis Date   CAD (coronary artery disease)    -s/p DES to LAD in May 2011   DM2 (diabetes mellitus, type 2) (HCC)    Hyperlipidemia    Hypertension     Medications: Outpatient Medications Prior to Visit  Medication Sig   aspirin EC 81 MG tablet Take 81 mg by mouth.   atorvastatin (LIPITOR) 40 MG tablet Take 1 tablet (40 mg total) by mouth daily.   Blood Glucose Monitoring Suppl DEVI 1 each by Does not apply route in the morning, at noon, and at bedtime. May substitute to any manufacturer covered by patient's insurance.   glipiZIDE (GLUCOTROL) 10 MG tablet Take 1 tablet (10 mg total) by mouth daily.   glipiZIDE (GLUCOTROL) 5 MG tablet TAKE 1 TABLET BY MOUTH TWICE DAILY BEFORE A MEAL   JARDIANCE 25 MG TABS tablet Take 1 tablet (25 mg total) by mouth daily.   lisinopril (ZESTRIL) 5 MG tablet Take 1 tablet by mouth once daily (Patient taking differently: Take 2.5 mg by mouth daily.)   ONE TOUCH ULTRA TEST test strip 1 each by Other route 2 (two) times daily. for testing   pioglitazone (ACTOS) 30 MG tablet Take 1 tablet (30 mg total) by mouth daily.   [DISCONTINUED] methylPREDNISolone (MEDROL DOSEPAK) 4 MG TBPK tablet Take 6 pills on  day one then decrease by 1 pill each day   No facility-administered medications prior to visit.    Review of Systems  Last metabolic panel Lab Results  Component Value Date   GLUCOSE 334 (H) 11/21/2022   NA 136 11/21/2022   K 4.7 11/21/2022   CL 99 11/21/2022   CO2 19 (L) 11/21/2022   BUN 36 (H) 11/21/2022   CREATININE 1.47 (H) 11/21/2022   EGFR 39 (L) 11/21/2022   CALCIUM 9.3 11/21/2022   PHOS 3.7 07/15/2019   PROT 7.3 11/21/2022   ALBUMIN 4.5 11/21/2022   LABGLOB 2.8 11/21/2022   AGRATIO 1.6  11/21/2022   BILITOT 0.2 11/21/2022   ALKPHOS 126 (H) 11/21/2022   AST 14 11/21/2022   ALT 15 11/21/2022   Last lipids Lab Results  Component Value Date   CHOL 199 03/02/2019   HDL 59 03/02/2019   LDLCALC 106 (H) 03/02/2019   TRIG 170 (H) 03/02/2019   CHOLHDL 3.4 03/02/2019   Last hemoglobin A1c Lab Results  Component Value Date   HGBA1C 11.6 (A) 08/28/2023        Objective    BP 139/81   Pulse 93   Resp 16   Ht 5\' 6"  (1.676 m)   Wt 176 lb (79.8 kg)   SpO2 96%   BMI 28.41 kg/m  BP Readings from Last 3 Encounters:  08/28/23 139/81  05/11/23 (!) 166/79  12/25/22 118/78   Wt Readings from Last 3 Encounters:  08/28/23 176 lb (79.8 kg)  05/11/23 165 lb (74.8 kg)  12/25/22 178 lb 9.6 oz (81 kg)        Physical Exam Vitals reviewed.  Constitutional:      General: She is not in acute distress.    Appearance: Normal appearance. She is not ill-appearing, toxic-appearing or diaphoretic.  Eyes:     Conjunctiva/sclera: Conjunctivae normal.  Cardiovascular:     Rate and Rhythm: Normal rate and regular rhythm.     Pulses: Normal pulses.     Heart sounds: Normal heart sounds. No murmur heard.    No friction rub. No gallop.  Pulmonary:     Effort: Pulmonary effort is normal. No respiratory distress.     Breath sounds: Normal breath sounds. No stridor. No wheezing, rhonchi or rales.  Abdominal:     General: Bowel sounds are normal. There is no distension.     Palpations: Abdomen is soft.     Tenderness: There is no abdominal tenderness.  Musculoskeletal:        General: Swelling and tenderness present.     Right lower leg: No edema.     Left lower leg: No edema.     Comments: Right knee   Skin:    Findings: No erythema or rash.  Neurological:     Mental Status: She is alert and oriented to person, place, and time.       Results for orders placed or performed in visit on 08/28/23  POCT glycosylated hemoglobin (Hb A1C)  Result Value Ref Range   Hemoglobin  A1C 11.6 (A) 4.0 - 5.6 %   HbA1c POC (<> result, manual entry)     HbA1c, POC (prediabetic range)     HbA1c, POC (controlled diabetic range)      Assessment & Plan     Problem List Items Addressed This Visit       Cardiovascular and Mediastinum   HYPERTENSION, BENIGN - Primary    On lisinopril 2.5 mg as per nephrologist's recommendation. Non-compliance  noted. Agreed with reduced dose for kidney protection. - Continue lisinopril 2.5 mg daily        Endocrine   Hyperlipidemia associated with type 2 diabetes mellitus (HCC)    On atorvastatin 40 mg but experiencing leg pain, possibly medication-related. Suggested holding atorvastatin and considering Crestor if pain persists. - Hold atorvastatin - If leg pain resolves, restart atorvastatin every other day - Consider switching to Crestor if leg pain persists      Relevant Medications   Semaglutide (RYBELSUS) 3 MG TABS   Hyperglycemia due to type 2 diabetes mellitus (HCC)    Chronic conditions, not at goal  A1c improved from 13% to 11.6%, but remains above target (<7%). Non-compliance due to depression and life stressors. Leg pain possibly related to atorvastatin. Discussed importance of glucose control to prevent complications. Recommended adding Rybelsus to regimen for better A1c control and potential weight impact. - Order A1c point of care - Start Rybelsus 3 mg daily - Continue Jardiance 25 mg daily - Continue glipizide 15 mg daily - Continue Actos 30 mg daily - Refer to pharmacy team for Rybelsus cost assistance - Follow up in March to reassess A1c      Relevant Medications   Semaglutide (RYBELSUS) 3 MG TABS   Other Relevant Orders   POCT glycosylated hemoglobin (Hb A1C) (Completed)   AMB Referral [facility] Care Management         Depression Depression since July due to job loss and stressors. Improved with family and church support. No additional medication recommended at this time. - Monitor mood and consider further  intervention if symptoms worsen  General Health Maintenance Received Cologuard kit for colorectal cancer screening. Discussed pros and cons of Cologuard versus colonoscopy. Cologuard can miss up to 8% of cancers and is recommended every three years if no strong family history and no previous abnormal colonoscopies. - Pt agrees to complete and return Cologuard kit    Return in about 3 months (around 11/26/2023) for DM.         Ronnald Ramp, MD  [facility] [phone] (phone) [phone] (fax)  [facility]

## 2023-08-28 NOTE — Patient Instructions (Addendum)
VISIT SUMMARY:  Today, we discussed your diabetes management, leg discomfort, recent falls, and overall health. We reviewed your current medications and made some adjustments to better control your blood sugar and address your leg pain. We also talked about your recent depression and its impact on your health.  YOUR PLAN:  -UNCONTROLLED TYPE 2 DIABETES MELLITUS: Your blood sugar levels are still higher than we want them to be. We added a new medication called Rybelsus 3mg  to help lower your A1c, which measures your average blood sugar over the past three months. It's important to keep your blood sugar under control to avoid complications. Please continue taking Jardiance, Glipizide, and Actos as prescribed. We will check your A1c again in March. I have placed a referral to our pharmacy team to help with diabetes medication cost  -HYPERTENSION: Your blood pressure is being managed with Lisinopril, which helps protect your kidneys. Please continue taking it daily as prescribed.  -HYPERLIPIDEMIA: You have high cholesterol, which we are treating with Atorvastatin. Since you are experiencing leg pain, we will stop this medication for now. If the pain goes away, you can restart it every other day. If the pain continues, we may switch you to a different medication called Crestor.  -DEPRESSION: You have been feeling depressed due to recent life stressors. Your mood has improved with support from family and church. We will continue to monitor your mood and consider further treatment if needed.  -GENERAL HEALTH MAINTENANCE: You received a Cologuard kit for colorectal cancer screening. This test is done at home and can help detect cancer early. Please complete and return the kit. If the test is positive, we will schedule a colonoscopy to look more closely at your colon.  INSTRUCTIONS:  Please follow up in March to reassess your A1c and overall health. If you experience any new symptoms or have concerns  before then, do not hesitate to contact our office.

## 2023-08-29 NOTE — Assessment & Plan Note (Signed)
On atorvastatin 40 mg but experiencing leg pain, possibly medication-related. Suggested holding atorvastatin and considering Crestor if pain persists. - Hold atorvastatin - If leg pain resolves, restart atorvastatin every other day - Consider switching to Crestor if leg pain persists

## 2023-08-29 NOTE — Assessment & Plan Note (Signed)
On lisinopril 2.5 mg as per nephrologist's recommendation. Non-compliance noted. Agreed with reduced dose for kidney protection. - Continue lisinopril 2.5 mg daily

## 2023-08-29 NOTE — Assessment & Plan Note (Signed)
Chronic conditions, not at goal  A1c improved from 13% to 11.6%, but remains above target (<7%). Non-compliance due to depression and life stressors. Leg pain possibly related to atorvastatin. Discussed importance of glucose control to prevent complications. Recommended adding Rybelsus to regimen for better A1c control and potential weight impact. - Order A1c point of care - Start Rybelsus 3 mg daily - Continue Jardiance 25 mg daily - Continue glipizide 15 mg daily - Continue Actos 30 mg daily - Refer to pharmacy team for Rybelsus cost assistance - Follow up in March to reassess A1c

## 2023-09-04 ENCOUNTER — Other Ambulatory Visit: Payer: 59 | Admitting: Pharmacist

## 2023-09-04 NOTE — Progress Notes (Signed)
09/04/2023 Name: Kelly Manning MRN: [id number] DOB: 09-19-1956  Chief Complaint  Patient presents with   Diabetes    Kelly Manning is a 67 y.o. year old female who presented for a telephone visit.   They were referred to the pharmacist by their PCP for assistance in managing diabetes.    Subjective:  Care Team: Primary Care Provider: Ronnald Ramp, MD ; Next Scheduled Visit: 12/01/23 Clinical Pharmacist: Marlowe Aschoff, PharmD  Medication Access/Adherence  Current Pharmacy:  [facility] [address] [place] - [address] [place] [address] Phone: [phone] Fax: [phone]   Patient reports affordability concerns with their medications: No  Patient reports access/transportation concerns to their pharmacy: No  Patient reports adherence concerns with their medications:  No    Schedule: Works from [employer] to [employer]- drives about 1.5 hrs to get there Sleeps at 10AM-4PM  Leaves at 5:30PM **Call patient at 4-5PM  Diabetes:  Current medications: Jardiance 25mg  daily, Glipizide 10mg  + 5mg  at night, Actos 30mg  daily, Rybelsus 3mg  (has 30DS sample from office) Medications tried in the past: Metformin (contributing to leg issues)  Current glucose readings: 215 for 30 minutes after eating (9:30AM) Using One Touch meter; testing 1 times daily  Patient denies hypoglycemic s/sx including dizziness, shakiness, sweating. Patient denies hyperglycemic symptoms including polyuria, polydipsia, polyphagia, nocturia, neuropathy, blurred vision.  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 Sig Documenting Provider Last Dose Status Informant  aspirin EC 81 MG tablet [id number] Yes Take 81 mg by mouth. [provider] Taking Active   atorvastatin (LIPITOR) 40 MG tablet [id number] No Take 1 tablet (40 mg total) by mouth daily.  Patient not taking: Reported on 09/04/2023   Ronnald Ramp, MD Not Taking Active   Blood Glucose Monitoring Suppl DEVI [id number] Yes 1 each by Does not apply route in the morning, at noon, and at bedtime. May substitute to any manufacturer covered by patient's insurance. Simmons-Robinson, Makiera, MD Taking Active   glipiZIDE (GLUCOTROL) 10 MG tablet [id number] Yes Take 1 tablet (10 mg total) by mouth daily. Simmons-Robinson, Makiera, MD Taking Active   glipiZIDE (GLUCOTROL) 5 MG tablet [id number] Yes TAKE 1 TABLET BY MOUTH TWICE DAILY BEFORE A MEAL Simmons-Robinson, Makiera, MD Taking Active   JARDIANCE 25 MG TABS tablet [id number] Yes Take 1 tablet (  25 mg total) by mouth daily. Simmons-Robinson, Makiera, MD Taking Active   lisinopril (ZESTRIL) 5 MG tablet [id number] Yes Take 1 tablet by mouth once daily  Patient taking differently: Take 2.5 mg by mouth daily.   Simmons-Robinson, Makiera, MD Taking Active   ONE TOUCH ULTRA TEST test strip [id number] Yes 1 each by  Other route 2 (two) times daily. for testing Bosie Clos, MD Taking Active   pioglitazone (ACTOS) 30 MG tablet [id number] Yes Take 1 tablet (30 mg total) by mouth daily. Simmons-Robinson, Makiera, MD Taking Active   Semaglutide (RYBELSUS) 3 MG TABS [id number] Yes Take 1 tablet (3 mg total) by mouth daily. Simmons-Robinson, Makiera, MD Taking Active               Assessment/Plan:   Diabetes: - Currently uncontrolled - Reviewed long term cardiovascular and renal outcomes of uncontrolled blood sugar - Reviewed goal A1c, goal fasting, and goal 2 hour post prandial glucose - Patient has added some significant diet changes since out of her depressive phase- admits to discontinuing all of her medications prior to see which is  - Recommend to check glucose 1x daily- recommended checking prior to eating - MODIFY Rybelsus to 30 minutes prior to breakfast instead of after eating- no side effects yet  Other concerns: - Has been off the Atorvastatin for 7 days now and not noticed any changes in leg pains- unlikely the cause   Follow Up Plan:  - Follow-up on 09/26/23 at 10AM to see about leg pains without Atorvastatin and Rybelsus - Will need copay card attached for Jardiance when due on 09/24/23 and Rybelsus due on 09/28/23- will call the pharmacy prior to call to get set-up - Ask PCP about restarting Atorvastatin and discontinuing Actos for leg pain (confirms leg pain and not neuropathy based on description); if not resolved with this, recommend checking B12, Mg, and Vitamin D levels for cause  - Task set to send Rybelsus 7mg  Rx on 09/12/23 along with PA *For future, Lantus is a Tier 1 option; Toujeo is Tier 2 (has NOT discussed injections and insulin yet)  PCP response for 09/26/23 call: "You can start the crestor with her, I am ok with holding the actos for a trial and ok with increasing the 7mg  rybelsus"   Marlowe Aschoff, PharmD [facility] Phone Number: [phone]

## 2023-09-22 ENCOUNTER — Telehealth: Payer: Self-pay | Admitting: Pharmacist

## 2023-09-22 DIAGNOSIS — E1165 Type 2 diabetes mellitus with hyperglycemia: Secondary | ICD-10-CM

## 2023-09-22 MED ORDER — RYBELSUS 7 MG PO TABS
7.0000 mg | ORAL_TABLET | Freq: Every day | ORAL | 5 refills | Status: DC
Start: 2023-09-22 — End: 2023-11-18

## 2023-09-22 NOTE — Progress Notes (Signed)
   09/22/2023  Patient ID: Kelly Manning, female   DOB: [birth year], 67 y.o.   MRN: [id number]  Rx for Rybelsus 7mg  daily sent to [facility] along with PA submitted (Key: [id number]).    Marlowe Aschoff, PharmD [facility] Phone Number: [phone]

## 2023-09-25 ENCOUNTER — Other Ambulatory Visit: Payer: Self-pay | Admitting: Family Medicine

## 2023-09-25 ENCOUNTER — Telehealth: Payer: Self-pay | Admitting: Pharmacist

## 2023-09-25 DIAGNOSIS — E1165 Type 2 diabetes mellitus with hyperglycemia: Secondary | ICD-10-CM

## 2023-09-25 NOTE — Progress Notes (Signed)
   09/25/2023  Patient ID: Kelly Manning, female   DOB: December 17, 1955, 68 y.o.   MRN: [id number]  Called the pharmacy and added discount cards. Rybelsus  is now $14.99 anJardiance  is now $10- each for 30 day supply. Waiting on refill sent today by Dr. Marcine for Jardiance  to be able to run through.   As for Rybelsus , pharmacy said ID was invalid when processed. Called the pharmacist support number and then they gave me an activation number. Called both options. Activation number said that the it was already activated in their system- however advised that even when trying to use the automated system, it said that the number was invalid. However, will try again at the end of the day. If no success, will try downloading a new coupon card online again.    Aloysius Breeding, PharmD [facility] Phone Number: [phone]

## 2023-09-26 ENCOUNTER — Other Ambulatory Visit: Payer: Self-pay | Admitting: Pharmacist

## 2023-09-26 NOTE — Progress Notes (Signed)
 09/26/2023 Name: Kelly Manning MRN: [id number] DOB: [birth year]  Chief Complaint  Patient presents with   Diabetes    Kelly Manning is a 68 y.o. year old female who presented for a telephone visit.   They were referred to the pharmacist by their PCP for assistance in managing diabetes.    Subjective:  Care Team: Primary Care Provider: Sharma Coyer, MD ; Next Scheduled Visit: 12/01/23 Clinical Pharmacist: Aloysius Breeding, PharmD  Medication Access/Adherence  Current Pharmacy:  [facility] [address], [place] - [address] [place] [address] Phone: [phone] Fax: [phone]   Patient reports affordability concerns with their medications: No  Patient reports access/transportation concerns to their pharmacy: No  Patient reports adherence concerns with their medications:  No    Schedule: Works from [employer] to [place]- drives about 1.5 hrs to get there Sleeps at 10AM-4PM  Leaves at 5:30PM **Call patient at 4-5PM or 10AM  Diabetes:  Current medications: Jardiance  25mg  daily, Glipizide  10mg  + 5mg  at night, Actos  30mg  daily, Rybelsus  3mg  (has 30DS sample from office) Medications tried in the past: Metformin  (contributing to leg issues)  Lowest BG readings from 09/26/23 visit: 145 Current glucose readings: 215 for 30 minutes after eating (9:30AM) Using One Touch meter; testing 1 times daily  Patient denies hypoglycemic s/sx including dizziness, shakiness, sweating. Patient denies hyperglycemic symptoms including polyuria, polydipsia, polyphagia, nocturia, neuropathy, blurred vision.  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 [employer])   Objective:  Lab Results  Component Value Date   HGBA1C 11.6 (A) 08/28/2023    Lab Results  Component Value Date   CREATININE 1.47 (H) 11/21/2022   BUN 36 (H) 11/21/2022   NA 136 11/21/2022   K 4.7 11/21/2022   CL 99 11/21/2022   CO2 19 (L) 11/21/2022    Lab Results  Component Value Date   CHOL 199 03/02/2019   HDL 59 03/02/2019   LDLCALC 106 (H) 03/02/2019   TRIG 170 (H) 03/02/2019   CHOLHDL 3.4 03/02/2019    Medications Reviewed Today   Medications were not reviewed in this encounter       Assessment/Plan:   Diabetes: - Currently uncontrolled - Reviewed long term cardiovascular and renal outcomes of uncontrolled blood sugar - Reviewed goal A1c, goal fasting, and goal 2 hour post prandial glucose - Patient has added some significant diet changes since out of her depressive phase- admits to discontinuing all of her medications prior to see which is  - Recommend to check glucose 1x daily- recommended checking prior to eating - MODIFY Rybelsus  to 30 minutes prior to breakfast instead of after eating- no side effects yet  Other concerns: - Has been off the Atorvastatin  for 7 days now and not noticed any changes in leg pains- unlikely the cause   Follow Up Plan:  - Follow-up on 10/13/23 at 10AM to see about how Rybelsus  is doing and stopping Actos  for leg pains + restart statin - Patient was very sick on the phone today- no changes made until she recovers first -  Rybelsus  is now $15 anJardiance  $10 for 30DS- advised if issues at the pharmacy to ask them how it was run prior and to do that again for same pricing - At next visit, hold the Actos  to see if cause of pains and then start Rosuvastatin ; if not resolved with this, recommend checking B12, Mg, and Vitamin D  levels for cause   *For future, Lantus  is a Tier 1  option; Toujeo is Tier 2 (has NOT discussed injections and insulin yet)  PCP response for 09/26/23 call: You can start the crestor  with her, I am ok with holding the actos  for a trial and ok with increasing the 7mg  rybelsus    Aloysius Breeding, PharmD [facility] Phone Number: [phone]

## 2023-10-13 ENCOUNTER — Other Ambulatory Visit: Payer: Self-pay | Admitting: Pharmacist

## 2023-10-13 DIAGNOSIS — E1165 Type 2 diabetes mellitus with hyperglycemia: Secondary | ICD-10-CM

## 2023-10-13 MED ORDER — RYBELSUS 14 MG PO TABS
14.0000 mg | ORAL_TABLET | Freq: Every day | ORAL | 5 refills | Status: AC
Start: 2023-10-13 — End: ?

## 2023-10-13 NOTE — Progress Notes (Signed)
10/13/2023 Name: Kelly Manning MRN: [id number] DOB: Dec 12, 1955  Chief Complaint  Patient presents with   Diabetes    Kelly Manning is a 68 y.o. year old female who presented for a telephone visit.   They were referred to the pharmacist by their PCP for assistance in managing diabetes.    Subjective:  Care Team: Primary Care Provider: Ronnald Ramp, MD ; Next Scheduled Visit: 12/01/23 Clinical Pharmacist: Marlowe Aschoff, PharmD  Medication Access/Adherence  Current Pharmacy:  [facility] [address] [place] - [address] [place] [address] Phone: [phone] Fax: [phone]   Patient reports affordability concerns with their medications: No  Patient reports access/transportation concerns to their pharmacy: No  Patient reports adherence concerns with their medications:  No    Schedule: Works from [employer] to [employer]- drives about 1.5 hrs to get there Sleeps at 10AM-4PM  Leaves at 5:30PM **Call patient at 4-5PM or 10AM  Diabetes:  Current medications: Jardiance 25mg  daily, Glipizide 10mg  + 5mg  at night, Actos 30mg  daily, Rybelsus 7mg  Medications tried in the past: Metformin (contributing to leg issues)  Current glucose readings from 10/13/23 visit: 170-180 (reports the readings are coming down) Current glucose readings from 09/26/23 visit: 145 lowest, 215 for 30 minutes after eating (9:30AM) Using One Touch meter; testing 1 times daily  Patient denies hypoglycemic s/sx including dizziness, shakiness, sweating. Patient denies hyperglycemic symptoms including polyuria, polydipsia, polyphagia, nocturia, neuropathy, blurred vision.  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[employer] and starts work at [employer])   Objective:  Lab Results  Component Value Date   HGBA1C 11.6 (A) 08/28/2023    Lab Results  Component Value Date   CREATININE 1.47 (H) 11/21/2022   BUN 36 (H) 11/21/2022   NA 136 11/21/2022   K 4.7 11/21/2022   CL 99 11/21/2022   CO2 19 (L) 11/21/2022    Lab Results  Component Value Date   CHOL 199 03/02/2019   HDL 59 03/02/2019   LDLCALC 106 (H) 03/02/2019   TRIG 170 (H) 03/02/2019   CHOLHDL 3.4 03/02/2019    Medications Reviewed Today   Medications were not reviewed in this encounter       Assessment/Plan:   Diabetes: - Currently uncontrolled - Reviewed long term cardiovascular and renal outcomes of uncontrolled blood sugar - Reviewed goal A1c, goal fasting, and goal 2 hour post prandial glucose - Patient has added some significant diet changes since out of her depressive phase- admits to discontinuing all of her medications prior to see which is  - Recommend to check glucose 1x daily- recommended checking prior to eating   Other concerns: - Has been off the Atorvastatin for 7 days now and not noticed any changes in leg pains- unlikely the cause - Switch for Rosuvastatin 20mg  daily- can restart at half tablet first   Follow Up Plan:  - Follow-up on 10/27/23 at 10AM to see about how Rybelsus is doing and stopping Actos for leg pains + restart statin - HOLD Actos 30mg  for 2 weeks to see if the leg pains resolve-  aware BG will be higher - Start Rosuvastatin at the next visit regardless of leg pain outcome - Send new Rx for Rybelsus 14mg  daily - Verify glipizide dosing at next visit as it will need updated for day supply for adherence metrics - Patient has concerns surrounding low appetite recently and weight loss, but did NOT reflect a lower weight than the 176 lbs at last  visit- advised to obtain weight on work scale prior to next call - Rybelsus is now $15 and Jardiance $10 for 30DS- advised if issues at the pharmacy to ask them how it was run prior and to do that again for same pricing - If leg pains not resolved, recommend checking B12, Mg, and Vitamin D levels for cause   *For future, Lantus is a Tier 1 option; Toujeo is Tier 2 (has NOT discussed injections and insulin yet)  PCP response for 09/26/23 call: "You can start the crestor with her, I am ok with holding the actos for a trial and ok with increasing the 7mg  rybelsus"   Marlowe Aschoff, PharmD [facility] Phone Number: [phone]

## 2023-10-16 ENCOUNTER — Telehealth: Payer: Self-pay | Admitting: Pharmacist

## 2023-10-16 ENCOUNTER — Other Ambulatory Visit: Payer: Self-pay | Admitting: Family Medicine

## 2023-10-16 DIAGNOSIS — I10 Essential (primary) hypertension: Secondary | ICD-10-CM

## 2023-10-16 DIAGNOSIS — E1165 Type 2 diabetes mellitus with hyperglycemia: Secondary | ICD-10-CM

## 2023-10-16 NOTE — Progress Notes (Signed)
   10/16/2023  Patient ID: Kelly Manning, female   DOB: 28-Sep-1955, 68 y.o.   MRN: [id number]  Called the pharmacy and Rybelsus 14mg  was filled with discount card making it $15 once again. Insurance will only allow a 30 DS however. No further concerns at this time. Patient will pick up next dose when due.   Marlowe Aschoff, PharmD [facility] Phone Number: [phone]

## 2023-10-27 ENCOUNTER — Other Ambulatory Visit: Payer: Self-pay | Admitting: Pharmacist

## 2023-10-27 NOTE — Progress Notes (Addendum)
10/27/2023 Name: Kelly Manning MRN: [id number] DOB: 01/20/1956  Chief Complaint  Patient presents with   Diabetes    Kelly Manning is a 68 y.o. year old female who presented for a telephone visit.   They were referred to the pharmacist by their PCP for assistance in managing diabetes.    Subjective:  Care Team: Primary Care Provider: Ronnald Ramp, MD ; Next Scheduled Visit: 12/01/23 Clinical Pharmacist: Marlowe Aschoff, PharmD  Medication Access/Adherence  Current Pharmacy:  [facility] [address] [place] - [address] [place] [address] Phone: [phone] Fax: [phone]   Patient reports affordability concerns with their medications: No  Patient reports access/transportation concerns to their pharmacy: No  Patient reports adherence concerns with their medications:  No    Schedule: Works from [employer] to [employer]- drives about 1.5 hrs to get there Sleeps at 10AM-4PM  Leaves at 5:30PM **Call patient at 4-5PM or 10AM  Diabetes:  Current medications: Jardiance 25mg  daily, Glipizide 10mg  + 5mg  at night, Actos 30mg  daily (was holding), Rybelsus 14mg  Medications tried in the past: Metformin (contributing to leg issues)  Current glucose readings from 10/13/23 visit: 170-180 (reports the readings are coming down) Current glucose readings from 09/26/23 visit: 145 lowest, 215 for 30 minutes after eating (9:30AM) Using One Touch meter; testing 1 times daily  Patient denies hypoglycemic s/sx including dizziness, shakiness, sweating. Patient denies hyperglycemic symptoms including polyuria, polydipsia, polyphagia, nocturia, neuropathy, blurred vision.  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[employer] and starts work at [employer])   Objective:  Lab Results  Component Value Date   HGBA1C 11.6 (A) 08/28/2023    Lab Results  Component Value Date   CREATININE 1.47 (H) 11/21/2022   BUN 36 (H) 11/21/2022   NA 136 11/21/2022   K 4.7 11/21/2022   CL 99 11/21/2022   CO2 19 (L) 11/21/2022    Lab Results  Component Value Date   CHOL 199 03/02/2019   HDL 59 03/02/2019   LDLCALC 106 (H) 03/02/2019   TRIG 170 (H) 03/02/2019   CHOLHDL 3.4 03/02/2019    Medications Reviewed Today   Medications were not reviewed in this encounter       Assessment/Plan:   Diabetes: - Currently uncontrolled - Reviewed long term cardiovascular and renal outcomes of uncontrolled blood sugar - Reviewed goal A1c, goal fasting, and goal 2 hour post prandial glucose - Patient has added some significant diet changes since out of her depressive phase- admits to discontinuing all of her medications prior to see which is  - Recommend to check glucose 1x daily- recommended checking prior to eating   Other concerns: - Has been off the Atorvastatin for 7 days now and not noticed any changes in leg pains- unlikely the cause - Switch for Rosuvastatin 20mg  daily- can restart at half tablet first   Follow Up Plan:  - Follow-up on 11/05/23 to see if leg pains return when restarting all of her medications- if so, believe it may be the Jardiance causing it  - Will then do a trial off  Jardiance to see if they resolve- may need to try [place] or lower Jardiance dose - Confirms no change in leg pains with Actos or Atorvastatin- however noticed a difference when she ran out of Jardiance for 3 days - Start Rosuvastatin at the next visit regardless of leg pain outcome - Advised once leg pains are resolved, we will maximize medications and then last  option is insulin  - Rybelsus is now $15 and Jardiance $10 for 30DS- advised if issues at the pharmacy to ask them how it was run prior and to do that again for same pricing - If leg pains not resolved, recommend checking B12, Mg, and Vitamin D levels for cause   *For future, Lantus is a Tier 1 option; Toujeo is Tier 2 (has NOT discussed injections and insulin yet)  PCP response for 09/26/23 call: "You can start the crestor with her, I am ok with holding the actos for a trial and ok with increasing the 7mg  rybelsus"   Marlowe Aschoff, PharmD [facility] Phone Number: [phone]

## 2023-11-05 ENCOUNTER — Telehealth: Payer: Self-pay | Admitting: Pharmacist

## 2023-11-05 ENCOUNTER — Other Ambulatory Visit: Payer: Self-pay | Admitting: Pharmacist

## 2023-11-05 NOTE — Progress Notes (Signed)
   11/05/2023  Patient ID: Kelly Manning, female   DOB: Apr 13, 1956, 68 y.o.   MRN: [id number]  Unable to reach at this time for medication tolerability follow-up. Left HIPAA compliant voicemail requesting call back at earliest convenience. Will try again in about 1 week.     Marlowe Aschoff, PharmD [facility] Phone Number: [phone]

## 2023-11-06 ENCOUNTER — Other Ambulatory Visit: Payer: 59 | Admitting: Pharmacist

## 2023-11-18 ENCOUNTER — Telehealth: Payer: Self-pay | Admitting: Pharmacist

## 2023-11-18 DIAGNOSIS — I10 Essential (primary) hypertension: Secondary | ICD-10-CM

## 2023-11-18 MED ORDER — LISINOPRIL 5 MG PO TABS
5.0000 mg | ORAL_TABLET | Freq: Every day | ORAL | 0 refills | Status: DC
Start: 2023-11-18 — End: 2023-12-25

## 2023-11-18 NOTE — Progress Notes (Signed)
 11/18/2023 Name: Kelly Manning MRN: [id number] DOB: Jun 02, 1956  Chief Complaint  Patient presents with   Diabetes    Kelly Manning is a 68 y.o. year old female who presented for a telephone visit.   They were referred to the pharmacist by their PCP for assistance in managing diabetes.    Subjective:  Care Team: Primary Care Provider: Ronnald Ramp, MD ; Next Scheduled Visit: 12/01/23 Clinical Pharmacist: Marlowe Aschoff, PharmD  Medication Access/Adherence  Current Pharmacy:  [facility] [address] [place] - [address] [place] [address] Phone: [phone] Fax: [phone]   Patient reports affordability concerns with their medications: No  Patient reports access/transportation concerns to their pharmacy: No  Patient reports adherence concerns with their medications:  No    Schedule: Works from [employer] to [employer]- drives about 1.5 hrs to get there Sleeps at 10AM-4PM  Leaves at 5:30PM **Call patient at 4-5PM or 10AM  Diabetes:  Current medications: Jardiance 25mg  daily, Glipizide 10mg  + 5mg  at night, Actos 30mg  daily, Rybelsus 14mg  Medications tried in the past: Metformin (contributing to leg issues)  Current glucose readings from 10/13/23 visit: 170-180 (reports the readings are coming down) Current glucose readings from 09/26/23 visit: 145 lowest, 215 for 30 minutes after eating (9:30AM) Using One Touch meter; testing 1 times daily  Patient denies hypoglycemic s/sx including dizziness, shakiness, sweating. Patient denies hyperglycemic symptoms including polyuria, polydipsia, polyphagia, nocturia, neuropathy, blurred vision.  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 Sig Documenting Provider Last Dose Status Informant  aspirin EC 81 MG tablet [id number] Yes Take 81 mg by mouth. [provider] Taking Active   atorvastatin (LIPITOR) 40 MG tablet [id number] No Take 1 tablet (40 mg total) by mouth daily.  Patient not taking: Reported on 11/18/2023   Ronnald Ramp, MD Not Taking Active   Blood Glucose Monitoring Suppl DEVI [id number] Yes 1 each by Does not apply route in the morning, at noon, and at bedtime. May substitute to any manufacturer covered by patient's insurance. Simmons-Robinson, Makiera, MD Taking Active   glipiZIDE (GLUCOTROL) 10 MG tablet [id number] Yes Take 1 tablet (10 mg total) by mouth daily. Simmons-Robinson, Makiera, MD Taking Active   glipiZIDE (GLUCOTROL) 5 MG tablet [id number] Yes TAKE 1 TABLET BY MOUTH TWICE DAILY BEFORE A MEAL Simmons-Robinson, Makiera,  MD Taking Active   JARDIANCE 25 MG TABS tablet [id number] Yes Take 1 tablet by mouth once daily Simmons-Robinson, Makiera, MD Taking Active   lisinopril (ZESTRIL) 5 MG tablet [id number] Yes Take 0.5 tablets (2.5 mg total) by mouth daily. Simmons-Robinson, Makiera, MD Taking Active   ONE TOUCH ULTRA TEST test strip  [id number] Yes 1 each by Other route 2 (two) times daily. for testing Bosie Clos, MD Taking Active   pioglitazone (ACTOS) 30 MG tablet [id number] Yes Take 1 tablet by mouth once daily Simmons-Robinson, Makiera, MD Taking Active   Semaglutide (RYBELSUS) 14 MG TABS [id number] Yes Take 1 tablet (14 mg total) by mouth daily before breakfast. Simmons-Robinson, Makiera, MD Taking Active               Assessment/Plan:   Diabetes: - Currently uncontrolled - Reviewed long term cardiovascular and renal outcomes of uncontrolled blood sugar - Reviewed goal A1c, goal fasting, and goal 2 hour post prandial glucose - Patient has added some significant diet changes since out of her depressive phase- admits to discontinuing all of her medications prior to see which is working - Recommend to check glucose 1x daily- recommended checking prior to eating   Other concerns: - Has been off the Atorvastatin for 7 days now and not noticed any changes in leg pains- unlikely the cause - Switch for Rosuvastatin 20mg  daily- can restart at half tablet first (note added for next PCP visit)   Follow Up Plan:  - Follow-up on 12/22/23 to see how A1c check resulted and PCP visit on 12/01/23 - Confirms re-starting all of her BG medications as prescribed - Also reports restarting Lisinopril 5mg  daily- updated Rx sent to the pharmacy to reflect this - Confirms no change in leg pains with Actos or Atorvastatin- however noticed a difference when she ran out of Jardiance for 3 days - Start Rosuvastatin at the next PCP visit - Advised once leg pains are resolved, we will maximize medications and then last option is insulin  - Rybelsus is now $15 and Jardiance $10 for 30DS- advised if issues at the pharmacy to ask them how it was run prior and to do that again for same pricing - If leg pains not resolved, recommend checking B12, Mg, and Vitamin D levels for cause   *For future, Lantus is a Tier 1 option; Toujeo is  Tier 2 (has NOT discussed injections and insulin yet)   Marlowe Aschoff, PharmD [facility] Phone Number: [phone]

## 2023-11-26 ENCOUNTER — Other Ambulatory Visit: Payer: Self-pay | Admitting: Family Medicine

## 2023-11-26 DIAGNOSIS — E1169 Type 2 diabetes mellitus with other specified complication: Secondary | ICD-10-CM

## 2023-11-26 DIAGNOSIS — E1165 Type 2 diabetes mellitus with hyperglycemia: Secondary | ICD-10-CM

## 2023-12-01 ENCOUNTER — Ambulatory Visit: Payer: Self-pay | Admitting: Family Medicine

## 2023-12-22 ENCOUNTER — Other Ambulatory Visit: Payer: Self-pay | Admitting: Pharmacist

## 2023-12-22 NOTE — Progress Notes (Addendum)
 12/22/2023 Name: Kelly Manning MRN: [id number] DOB: 1956/01/01  Chief Complaint  Patient presents with   Diabetes    Kelly Manning is a 68 y.o. year old female who presented for a telephone visit.   They were referred to the pharmacist by their PCP for assistance in managing diabetes.    Subjective:  Care Team: Primary Care Provider: Ronnald Ramp, MD ; Next Scheduled Visit: 12/24/23 Clinical Pharmacist: Marlowe Aschoff, PharmD  Medication Access/Adherence  Current Pharmacy:  [facility] [address] [place] - [address] [place] [address] Phone: [phone] Fax: [phone]   Patient reports affordability concerns with their medications: No  Patient reports access/transportation concerns to their pharmacy: No  Patient reports adherence concerns with their medications:  No    Schedule: Works from [employer] to [employer]- drives about 1.5 hrs to get there Sleeps at 10AM-4PM  Leaves at 5:30PM **Call patient at 4-5PM or 10AM  Diabetes:  Current medications: Jardiance 25mg  daily, Glipizide 10mg  + 5mg  at night, Actos 30mg  daily, Rybelsus 14mg  Medications tried in the past: Metformin (contributing to leg issues)  Current glucose readings from 12/22/23: 200's (occasional 170 per patient) Current glucose readings from 10/13/23 visit: 170-180 (reports the readings are coming down) Current glucose readings from 09/26/23 visit: 145 lowest, 215 for 30 minutes after eating (9:30AM) Using One Touch meter; testing 1 times daily  Patient denies hypoglycemic s/sx including dizziness, shakiness, sweating. Patient denies hyperglycemic symptoms including polyuria, polydipsia, polyphagia, nocturia, neuropathy, blurred vision.  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[employer])   Objective:  Lab Results  Component Value Date   HGBA1C 11.6 (A) 08/28/2023    Lab Results  Component Value Date   CREATININE 1.47 (H) 11/21/2022   BUN 36 (H) 11/21/2022   NA 136 11/21/2022   K 4.7 11/21/2022   CL 99 11/21/2022   CO2 19 (L) 11/21/2022    Lab Results  Component Value Date   CHOL 199 03/02/2019   HDL 59 03/02/2019   LDLCALC 106 (H) 03/02/2019   TRIG 170 (H) 03/02/2019   CHOLHDL 3.4 03/02/2019    Medications Reviewed Today   Medications were not reviewed in this encounter       Assessment/Plan:   Diabetes: - Currently uncontrolled - Reviewed long term cardiovascular and renal outcomes of uncontrolled blood sugar - Reviewed goal A1c, goal fasting, and goal 2 hour post prandial glucose - Patient has added some significant diet changes since out of her depressive phase- admits to discontinuing all of her medications prior to see which is working - Recommend to check glucose 1x daily- recommended checking prior to eating   Other concerns: - Has been off the Atorvastatin for 7 days now and not noticed any changes in leg pains- unlikely the cause - Switch for Rosuvastatin 20mg  daily- can restart at half tablet first (note added for next PCP visit)   Follow Up Plan:  - Follow-up on 01/12/24 for BG review + see if insulin and Crestor started - Confirms continuing Rybelsus 14mg  daily  and NOT missing doses (6 tablets left)- even though she is past due according to fill history - Leg pains are still randomly occurring- aware likely not related to medications  - Recommend checking B12, Mg, and vitamin D levels for cause - Start Rosuvastatin at the next PCP visit - Patient planning to bring up  insulin at next PCP visit- said BG readings are still high  - A1c is due to be re-checked  - Rybelsus is now $15 and Jardiance $10 for 30DS- advised if issues at the pharmacy to ask them how it was run prior and to do that again for same pricing   *For future, Lantus is a Tier 1 option; Toujeo is Tier 2 (has NOT discussed injections and insulin yet)   Marlowe Aschoff, PharmD [facility] Phone Number: [phone]

## 2023-12-24 ENCOUNTER — Encounter: Payer: Self-pay | Admitting: Family Medicine

## 2023-12-24 ENCOUNTER — Ambulatory Visit: Admitting: Family Medicine

## 2023-12-24 VITALS — BP 122/69 | HR 85 | Temp 98.6°F | Resp 16 | Ht 66.0 in | Wt 166.7 lb

## 2023-12-24 DIAGNOSIS — Z780 Asymptomatic menopausal state: Secondary | ICD-10-CM

## 2023-12-24 DIAGNOSIS — N1832 Chronic kidney disease, stage 3b: Secondary | ICD-10-CM

## 2023-12-24 DIAGNOSIS — E1169 Type 2 diabetes mellitus with other specified complication: Secondary | ICD-10-CM

## 2023-12-24 DIAGNOSIS — E785 Hyperlipidemia, unspecified: Secondary | ICD-10-CM

## 2023-12-24 DIAGNOSIS — Z0000 Encounter for general adult medical examination without abnormal findings: Secondary | ICD-10-CM

## 2023-12-24 DIAGNOSIS — Z1211 Encounter for screening for malignant neoplasm of colon: Secondary | ICD-10-CM

## 2023-12-24 DIAGNOSIS — R809 Proteinuria, unspecified: Secondary | ICD-10-CM

## 2023-12-24 DIAGNOSIS — Z7984 Long term (current) use of oral hypoglycemic drugs: Secondary | ICD-10-CM

## 2023-12-24 DIAGNOSIS — I10 Essential (primary) hypertension: Secondary | ICD-10-CM | POA: Diagnosis not present

## 2023-12-24 DIAGNOSIS — M791 Myalgia, unspecified site: Secondary | ICD-10-CM

## 2023-12-24 DIAGNOSIS — E559 Vitamin D deficiency, unspecified: Secondary | ICD-10-CM

## 2023-12-24 DIAGNOSIS — Z1231 Encounter for screening mammogram for malignant neoplasm of breast: Secondary | ICD-10-CM

## 2023-12-24 DIAGNOSIS — E1165 Type 2 diabetes mellitus with hyperglycemia: Secondary | ICD-10-CM

## 2023-12-24 MED ORDER — ROSUVASTATIN CALCIUM 20 MG PO TABS
20.0000 mg | ORAL_TABLET | Freq: Every day | ORAL | 3 refills | Status: DC
Start: 2023-12-24 — End: 2024-04-09

## 2023-12-24 NOTE — Assessment & Plan Note (Signed)
 Coronary artery disease with a drug-eluting stent placed in the LAD in May 2011. She is on aspirin for secondary prevention. - Continue aspirin 81 mg daily

## 2023-12-24 NOTE — Assessment & Plan Note (Signed)
Ordered DEXA scan today

## 2023-12-24 NOTE — Assessment & Plan Note (Signed)
 Routine health maintenance and screenings are due. - Order mammogram - Order Cologuard for colon cancer screening - Order bone density scan

## 2023-12-24 NOTE — Assessment & Plan Note (Signed)
 Urine albumine collected today  Continue to follow up with nephrology as scheduled  CMP collected today

## 2023-12-24 NOTE — Assessment & Plan Note (Signed)
 Chronic hypertension  Well controlled and at goal today   Currently on Lisinopril for management. Lisinopril is also used for renal protection in the context of chronic kidney disease. - Continue Lisinopril 5 mg daily, can consider decreasing to 2.5mg  daily given well controlled BP  - Monitor blood pressure regularly

## 2023-12-24 NOTE — Assessment & Plan Note (Signed)
 Ongoing intermittent leg cramps  Reports no symptoms today  Normal 5/5 strength in bilateral lower extremities today  - Order CK, magnesium, B12, and vitamin D levels

## 2023-12-24 NOTE — Progress Notes (Signed)
 Established patient visit   Patient: Kelly Manning   DOB: 1955-12-31   68 y.o. Female  MRN: [id number] Visit Date: 12/24/2023  Today's healthcare provider: Ronnald Ramp, MD   Chief Complaint  Patient presents with   Hypertension    No concerns   Diabetes    Pt reports sugar being up and down at home,    Hyperlipidemia    No concerns   Subjective     HPI     Hypertension    Additional comments: No concerns        Diabetes    Additional comments: Pt reports sugar being up and down at home,         Hyperlipidemia    Additional comments: No concerns      Last edited by Allayne Stack on 12/24/2023  8:34 AM.       Discussed the use of AI scribe software for clinical note transcription with the patient, who gave verbal consent to proceed.  History of Present Illness Kelly Manning is a 68 year old female with chronic type two diabetes, hypertension, and hyperlipidemia who presents for follow-up.  She has chronic type two diabetes managed with glipizide 15 mg in the morning and 5 mg at night, Jardiance 25 mg daily, Actos 30 mg once daily, and Rybelsus 14 mg daily. Her last hemoglobin A1c in December 2024 was 11.6, with a goal of less than 7. She experienced a depressive phase during which she stopped her medications, leading to uncontrolled diabetes. She is concerned about her glucose levels, which have been as high as 400, and mentions that she has been 'starving herself' and has lost 25 pounds.  Her hypertension is managed with lisinopril 5 mg daily. She reports that her blood pressure has been under borderline control, with the last reading being 139/81 in December 2024. She mentions that her blood pressure remains good even when off medication.  For her hyperlipidemia, her LDL was 106, measured since 2020. She is on aspirin 81 mg daily for coronary artery disease and had a drug-eluting stent placed in her LAD in May 2011. She reports leg cramps and  burning, tingling, and numbness in her legs, which she attributes to lisinopril. She has been off atorvastatin and reports that the leg cramps stopped for about a week.  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 Her last creatinine in February 2024 was 1.47 with a GFR of 39. She is evaluated by nephrology and her last visit was on November 19, 2023. During that visit, her creatinine was 1.3, GFR was 45, and she was classified as G3/A1.  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 She has been working nights for a long time and finds it rough on her body, eating, and sleep.     Past Medical History:  Diagnosis Date   CAD (coronary artery disease)    -s/p DES to LAD in May 2011   DM2 (diabetes mellitus, type 2) (HCC)    Hyperlipidemia    Hypertension     Medications: Outpatient Medications Prior to Visit  Medication Sig   aspirin EC 81 MG tablet Take 81 mg by mouth.   Blood Glucose Monitoring Suppl DEVI 1 each by Does not apply route in the morning, at noon, and at bedtime. May substitute to any manufacturer covered by  patient's insurance.   glipiZIDE (GLUCOTROL) 10 MG tablet Take 1 tablet by mouth once daily   glipiZIDE (GLUCOTROL) 5 MG tablet TAKE 1 TABLET BY MOUTH TWICE DAILY BEFORE A MEAL   JARDIANCE 25 MG TABS tablet Take 1 tablet by mouth once daily   lisinopril (ZESTRIL) 5 MG tablet Take 1 tablet (5 mg total) by mouth daily.   ONE TOUCH ULTRA TEST test strip 1 each by Other route 2 (two) times daily. for testing   pioglitazone (ACTOS) 30 MG tablet Take 1 tablet by mouth once daily   Semaglutide (RYBELSUS) 14 MG TABS Take 1 tablet (14 mg total) by mouth daily before breakfast.   [DISCONTINUED] atorvastatin (LIPITOR) 40 MG tablet Take 1 tablet (40 mg total) by mouth daily. (Patient not taking: Reported on 12/24/2023)   No facility-administered medications  prior to visit.    Review of Systems  Last metabolic panel Lab Results  Component Value Date   GLUCOSE 334 (H) 11/21/2022   NA 136 11/21/2022   K 4.7 11/21/2022   CL 99 11/21/2022   CO2 19 (L) 11/21/2022   BUN 36 (H) 11/21/2022   CREATININE 1.47 (H) 11/21/2022   EGFR 39 (L) 11/21/2022   CALCIUM 9.3 11/21/2022   PHOS 3.7 07/15/2019   PROT 7.3 11/21/2022   ALBUMIN 4.5 11/21/2022   LABGLOB 2.8 11/21/2022   AGRATIO 1.6 11/21/2022   BILITOT 0.2 11/21/2022   ALKPHOS 126 (H) 11/21/2022   AST 14 11/21/2022   ALT 15 11/21/2022   Last lipids Lab Results  Component Value Date   CHOL 199 03/02/2019   HDL 59 03/02/2019   LDLCALC 106 (H) 03/02/2019   TRIG 170 (H) 03/02/2019   CHOLHDL 3.4 03/02/2019     Last hemoglobin A1c Lab Results  Component Value Date   HGBA1C 11.6 (A) 08/28/2023        Objective    BP 122/69   Pulse 85   Temp 98.6 F (37 C)   Resp 16   Ht 5\' 6"  (1.676 m)   Wt 166 lb 11.2 oz (75.6 kg)   SpO2 100%   BMI 26.91 kg/m  BP Readings from Last 3 Encounters:  12/24/23 122/69  08/28/23 139/81  05/11/23 (!) 166/79   Wt Readings from Last 3 Encounters:  12/24/23 166 lb 11.2 oz (75.6 kg)  08/28/23 176 lb (79.8 kg)  05/11/23 165 lb (74.8 kg)        Physical Exam Cardiovascular:     Rate and Rhythm: Normal rate and regular rhythm.     Pulses:          Dorsalis pedis pulses are 2+ on the right side and 2+ on the left side.       Posterior tibial pulses are 2+ on the right side and 2+ on the left side.  Pulmonary:     Effort: Pulmonary effort is normal. No respiratory distress.     Breath sounds: Normal breath sounds. No wheezing, rhonchi or rales.  Abdominal:     General: Bowel sounds are normal. There is no distension.     Tenderness: There is no abdominal tenderness.  Musculoskeletal:     Right foot: No deformity.     Left foot: No deformity.  Feet:     Right foot:     Protective Sensation: 6 sites tested.  6 sites sensed.     Skin  integrity: No skin breakdown, erythema, dry skin or fissure.     Toenail Condition: Fungal disease present.  Left foot:     Protective Sensation: 6 sites tested.  6 sites sensed.     Skin integrity: Dry skin present. No skin breakdown, erythema or fissure.       No results found for any visits on 12/24/23.  Assessment & Plan     Problem List Items Addressed This Visit       Cardiovascular and Mediastinum   HYPERTENSION, BENIGN   Chronic hypertension  Well controlled and at goal today   Currently on Lisinopril for management. Lisinopril is also used for renal protection in the context of chronic kidney disease. - Continue Lisinopril 5 mg daily, can consider decreasing to 2.5mg  daily given well controlled BP  - Monitor blood pressure regularly      Relevant Medications   rosuvastatin (CRESTOR) 20 MG tablet   Other Relevant Orders   Comprehensive metabolic panel with GFR     Endocrine   Hyperlipidemia associated with type 2 diabetes mellitus (HCC)   Chronic hyperlipidemia associated with type 2 diabetes. LDL was 106 mg/dL. She had stopped rosuvastatin due to leg pain but is advised to restart statin therapy. Discussed restarting rosuvastatin at 20 mg daily after checking CK, magnesium, B12, and vitamin D levels to evaluate potential causes of leg cramps. - Restart rosuvastatin 20 mg daily  - Order lipid panel to assess current lipid levels      Relevant Medications   rosuvastatin (CRESTOR) 20 MG tablet   Other Relevant Orders   Lipid panel   Hyperglycemia due to type 2 diabetes mellitus (HCC) - Primary   Type 2 Diabetes Mellitus Chronic type 2 diabetes with poor glycemic control. Last hemoglobin A1c was 11.6% in December 2024, with a goal of less than 7%. Recent glucose levels have been severely elevated, with a reading of 409 mg/dL. She experienced a depressive phase and stopped medications, leading to uncontrolled diabetes. There is a consideration for starting insulin  therapy if glycemic control does not improve. Discussed the potential use of Lantus insulin if A1c remains above 10%, starting with 10 units at bedtime or in the morning, depending on her schedule. Emphasized the importance of controlling glucose levels to preserve kidney function and reduce cardiovascular risks. - Order hemoglobin A1c to assess current glycemic control - Consider starting Lantus insulin if A1c remains above 10% - Continue current diabetes medications: Glipizide 15mg  daily and 5mg  at bedtime, Jardiance 25mg  daily, Actos 30mg  daily , Rybelsus 14mg  daily  - Performed diabetes foot exam - Educate on dietary modifications, emphasizing low glycemic index fruits like berries - Encourage regular blood sugar monitoring - pt to re-schedule her DM eye exam at [facility]  - if A1c is not improved, will start lantus 10units daily and follow up in 4-6 weeks       Relevant Medications   rosuvastatin (CRESTOR) 20 MG tablet   Other Relevant Orders   Hemoglobin A1c   Urine microalbumin-creatinine with uACR     Genitourinary   CKD stage 3b, GFR 30-44 ml/min (HCC)   Chronic kidney disease stage 3B with proteinuria, likely secondary to diabetic kidney disease. Last creatinine was 1.86 mg/dL with a GFR of 30 WU/JWJ/1.91Y. She is under nephrology care and advised to avoid NSAIDs. SGLT2 inhibitors and ACE inhibitors are continued for renal protection. - Order complete metabolic panel to assess kidney function - Order urine albumin to monitor proteinuria - f/u with nephrology as scheduled  - Continue Jardiance 25mg  daily and lisinopril 5mg  daily  for renal protection - Avoid  NSAIDs        Other   Postmenopausal estrogen deficiency   Ordered DEXA scan today       Relevant Orders   DG Bone Density   Myalgia   Ongoing intermittent leg cramps  Reports no symptoms today  Normal 5/5 strength in bilateral lower extremities today  - Order CK, magnesium, B12, and vitamin D levels       Relevant Orders   Magnesium   B12   Vitamin D (25 hydroxy)   CK (Creatine Kinase)   Microalbuminuria   Urine albumine collected today  Continue to follow up with nephrology as scheduled  CMP collected today       Relevant Orders   Urine microalbumin-creatinine with uACR   Healthcare maintenance   Routine health maintenance and screenings are due. - Order mammogram - Order Cologuard for colon cancer screening - Order bone density scan      Other Visit Diagnoses       Encounter for screening mammogram for malignant neoplasm of breast       Relevant Orders   MM 3D SCREENING MAMMOGRAM BILATERAL BREAST     Colon cancer screening       Relevant Orders   Cologuard       Assessment & Plan   Follow-up Follow-up plans to monitor and adjust treatment as needed based on lab results and clinical status. If insulin is started, an earlier follow-up may be necessary to assess its effectiveness and adjust dosage. - Schedule follow-up appointment in 3 months - Consider earlier follow-up if insulin is started and glycemic control is not achieved - Ensure results of eye exam are sent to the clinic     Return in about 3 months (around 03/24/2024) for DM.         Ronnald Ramp, MD  [facility] [phone] (phone) [phone] (fax)  [facility]

## 2023-12-24 NOTE — Assessment & Plan Note (Signed)
 Chronic hyperlipidemia associated with type 2 diabetes. LDL was 106 mg/dL. She had stopped rosuvastatin due to leg pain but is advised to restart statin therapy. Discussed restarting rosuvastatin at 20 mg daily after checking CK, magnesium, B12, and vitamin D levels to evaluate potential causes of leg cramps. - Restart rosuvastatin 20 mg daily  - Order lipid panel to assess current lipid levels

## 2023-12-24 NOTE — Assessment & Plan Note (Signed)
 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 Last creatinine was 1.86 mg/dL with a GFR of 30 ZO/XWR/6.04V. She is under nephrology care and advised to avoid NSAIDs. SGLT2 inhibitors and ACE inhibitors are continued for renal protection. - Order complete metabolic panel to assess kidney function - Order urine albumin to monitor proteinuria - f/u with nephrology as scheduled  - Continue Jardiance 25mg  daily and lisinopril 5mg  daily  for renal protection - Avoid NSAIDs

## 2023-12-24 NOTE — Assessment & Plan Note (Signed)
 Type 2 Diabetes Mellitus Chronic type 2 diabetes with poor glycemic control. Last hemoglobin A1c was 11.6% in December 2024, with a goal of less than 7%. Recent glucose levels have been severely elevated, with a reading of 409 mg/dL. She experienced a depressive phase and stopped medications, leading to uncontrolled diabetes. There is a consideration for starting insulin therapy if glycemic control does not improve. Discussed the potential use of Lantus insulin if A1c remains above 10%, starting with 10 units at bedtime or in the morning, depending on her schedule. Emphasized the importance of controlling glucose levels to preserve kidney function and reduce cardiovascular risks. - Order hemoglobin A1c to assess current glycemic control - Consider starting Lantus insulin if A1c remains above 10% - Continue current diabetes medications: Glipizide 15mg  daily and 5mg  at bedtime, Jardiance 25mg  daily, Actos 30mg  daily , Rybelsus 14mg  daily  - Performed diabetes foot exam - Educate on dietary modifications, emphasizing low glycemic index fruits like berries - Encourage regular blood sugar monitoring - pt to re-schedule her DM eye exam at [facility]  - if A1c is not improved, will start lantus 10units daily and follow up in 4-6 weeks

## 2023-12-24 NOTE — Patient Instructions (Signed)
 VISIT SUMMARY:  Today, we reviewed your chronic conditions, including type 2 diabetes, hypertension, hyperlipidemia, and chronic kidney disease. We discussed your recent health concerns, including high blood sugar levels, leg cramps, and your current medications. We also talked about the importance of maintaining good control of your blood sugar, blood pressure, and cholesterol levels to protect your kidney function and overall health.  YOUR PLAN:  -TYPE 2 DIABETES MELLITUS: Your diabetes is currently not well controlled, with your last A1c being 11.6% and recent blood sugar levels very high. We discussed the possibility of starting insulin if your A1c remains above 10%. For now, continue your current medications (Glipizide, Jardiance, Actos, Rybelsus), and we will check your A1c again. It's important to monitor your blood sugar regularly and follow dietary recommendations, including eating low glycemic index fruits like berries.  -CHRONIC KIDNEY DISEASE STAGE 3B: Your kidney function is being monitored due to chronic kidney disease, likely caused by diabetes. We will continue your current medications (SGLT2 inhibitors and ACE inhibitors) to protect your kidneys and avoid NSAIDs. We will also check your kidney function and protein levels in your urine.  -HYPERTENSION: Your blood pressure is under borderline control. Continue taking Lisinopril 5 mg daily and monitor your blood pressure regularly to ensure it stays within a healthy range.  -HYPERLIPIDEMIA: Your cholesterol levels need to be managed to reduce cardiovascular risks. We discussed restarting rosuvastatin at 20 mg daily after checking some lab values to understand the cause of your leg cramps. We will also check your current cholesterol levels.  -CORONARY ARTERY DISEASE: You have a history of coronary artery disease and are taking aspirin to prevent further heart issues. Continue taking aspirin 81 mg daily.  -GENERAL HEALTH MAINTENANCE: We  will perform routine health screenings, including a mammogram, colon cancer screening (Cologuard), and a bone density scan to ensure your overall health is maintained.  INSTRUCTIONS:  Please schedule a follow-up appointment in 3 months. If we start insulin, we may need to see you earlier to adjust the dosage. Also, ensure that the results of your eye exam are sent to our clinic.

## 2023-12-25 ENCOUNTER — Other Ambulatory Visit: Payer: Self-pay | Admitting: Family Medicine

## 2023-12-25 DIAGNOSIS — E1169 Type 2 diabetes mellitus with other specified complication: Secondary | ICD-10-CM

## 2023-12-25 DIAGNOSIS — E1165 Type 2 diabetes mellitus with hyperglycemia: Secondary | ICD-10-CM

## 2023-12-25 DIAGNOSIS — I10 Essential (primary) hypertension: Secondary | ICD-10-CM

## 2023-12-27 LAB — CK: Total CK: 51 U/L (ref 32–182)

## 2023-12-28 LAB — COMPREHENSIVE METABOLIC PANEL WITH GFR
ALT: 13 IU/L (ref 0–32)
AST: 15 IU/L (ref 0–40)
Albumin: 4.3 g/dL (ref 3.9–4.9)
Alkaline Phosphatase: 120 IU/L (ref 44–121)
BUN/Creatinine Ratio: 32 — ABNORMAL HIGH (ref 12–28)
BUN: 46 mg/dL — ABNORMAL HIGH (ref 8–27)
Bilirubin Total: <0.2 mg/dL (ref 0.0–1.2)
CO2: 15 mmol/L — ABNORMAL LOW (ref 20–29)
Calcium: 9.6 mg/dL (ref 8.7–10.3)
Chloride: 101 mmol/L (ref 96–106)
Creatinine, Ser: 1.46 mg/dL — ABNORMAL HIGH (ref 0.57–1.00)
Globulin, Total: 3.3 g/dL (ref 1.5–4.5)
Glucose: 198 mg/dL — ABNORMAL HIGH (ref 70–99)
Potassium: 4.8 mmol/L (ref 3.5–5.2)
Sodium: 133 mmol/L — ABNORMAL LOW (ref 134–144)
Total Protein: 7.6 g/dL (ref 6.0–8.5)
eGFR: 39 mL/min/{1.73_m2} — ABNORMAL LOW (ref >59)

## 2023-12-28 LAB — MICROALBUMIN / CREATININE URINE RATIO
Creatinine, Urine: 85.3 mg/dL
Microalb/Creat Ratio: 35 mg/g{creat} — ABNORMAL HIGH (ref 0–29)
Microalbumin, Urine: 30 ug/mL

## 2023-12-28 LAB — LIPID PANEL
Chol/HDL Ratio: 4 ratio (ref 0.0–4.4)
Cholesterol, Total: 245 mg/dL — ABNORMAL HIGH (ref 100–199)
HDL: 62 mg/dL (ref >39)
LDL Chol Calc (NIH): 165 mg/dL — ABNORMAL HIGH (ref 0–99)
Triglycerides: 103 mg/dL (ref 0–149)
VLDL Cholesterol Cal: 18 mg/dL (ref 5–40)

## 2023-12-28 LAB — VITAMIN B12: Vitamin B-12: 1717 pg/mL — ABNORMAL HIGH (ref 232–1245)

## 2023-12-28 LAB — VITAMIN D 25 HYDROXY (VIT D DEFICIENCY, FRACTURES): Vit D, 25-Hydroxy: 11 ng/mL — ABNORMAL LOW (ref 30.0–100.0)

## 2023-12-28 LAB — HEMOGLOBIN A1C
Est. average glucose Bld gHb Est-mCnc: 349 mg/dL
Hgb A1c MFr Bld: 13.8 % — ABNORMAL HIGH (ref 4.8–5.6)

## 2023-12-28 LAB — MAGNESIUM: Magnesium: 2.2 mg/dL (ref 1.6–2.3)

## 2023-12-29 ENCOUNTER — Encounter: Payer: Self-pay | Admitting: Family Medicine

## 2023-12-29 DIAGNOSIS — Z794 Long term (current) use of insulin: Secondary | ICD-10-CM

## 2023-12-29 MED ORDER — VITAMIN D (ERGOCALCIFEROL) 1.25 MG (50000 UNIT) PO CAPS
50000.0000 [IU] | ORAL_CAPSULE | ORAL | 1 refills | Status: AC
  Filled 2024-05-28: qty 12, 84d supply, fill #0

## 2023-12-29 NOTE — Addendum Note (Signed)
 Addended by: Bing Neighbors on: 12/29/2023 11:40 AM   Modules accepted: Orders

## 2023-12-31 NOTE — Telephone Encounter (Signed)
 Pt returned a call regarding these results, I was notified by CAL that the cma was unavailable at the moment and she would receive another call soon

## 2024-01-01 NOTE — Telephone Encounter (Signed)
 Called and was able to inform the pt of her lab results and the medication Dr R wanted her to start taking, she wanted to know the recommendation for the vitamin D, she would also like to check the status on her starting insulin. Please advise

## 2024-01-02 MED ORDER — LANTUS SOLOSTAR 100 UNIT/ML ~~LOC~~ SOPN: 10.0000 [IU] | PEN_INJECTOR | Freq: Every day | SUBCUTANEOUS | 1 refills | Status: AC

## 2024-01-02 MED ORDER — BD PEN NEEDLE NANO U/F 32G X 4 MM MISC: 1.0000 | Freq: Every day | 3 refills | Status: AC

## 2024-01-05 NOTE — Telephone Encounter (Signed)
 Called and was able to inform the pt of the 2 medication Dr R has requested her to start vitD and Lantus. She verbally stated she understood and has picked both medication up already and plans to start them on Wed, she had no other questions nor concerns

## 2024-01-12 ENCOUNTER — Telehealth: Payer: Self-pay | Admitting: Pharmacist

## 2024-01-12 ENCOUNTER — Other Ambulatory Visit: Payer: Self-pay | Admitting: Pharmacist

## 2024-01-12 NOTE — Progress Notes (Signed)
   01/12/2024  Patient ID: Kelly Manning, female   DOB: 1955-09-29, 68 y.o.   MRN: [id number]  Attempted to contact patient for scheduled appointment for medication management. Left HIPAA compliant message for patient to return my call at their convenience.    Need to get BG readings to adjust Lantus  as needed. Will try again in 1 week.    Delvin File, PharmD [facility]  Phone Number: [phone]

## 2024-01-20 ENCOUNTER — Other Ambulatory Visit: Payer: Self-pay | Admitting: Family Medicine

## 2024-01-20 ENCOUNTER — Telehealth: Payer: Self-pay | Admitting: Pharmacist

## 2024-01-20 DIAGNOSIS — E1165 Type 2 diabetes mellitus with hyperglycemia: Secondary | ICD-10-CM

## 2024-01-20 MED ORDER — GLIPIZIDE 5 MG PO TABS: 5.0000 mg | ORAL_TABLET | Freq: Two times a day (BID) | ORAL | 0 refills | Status: DC

## 2024-01-20 NOTE — Progress Notes (Signed)
 01/20/2024 Name: Kelly Manning MRN: [id number] DOB: [birth year]  Chief Complaint  Patient presents with   Medication Management    Kelly Manning is a 68 y.o. year old female who presented for a telephone visit.   They were referred to the pharmacist by their PCP for assistance in managing diabetes.    Subjective:  Care Team: Primary Care Provider: Mimi Alt, MD ; Next Scheduled Visit: 03/24/24 Clinical Pharmacist: Delvin File, PharmD  Medication Access/Adherence  Current Pharmacy:  [facility] [address] [place] - [address] [place] [address] Phone: [phone] Fax: [phone]   Patient reports affordability concerns with their medications: No  Patient reports access/transportation concerns to their pharmacy: No  Patient reports adherence concerns with their medications:  No    Schedule: Works from [employer] to [employer]- drives about 1.5 hrs to get there Sleeps at 10AM-4PM  Leaves at 5:30PM **Call patient at 4-5PM or 10AM  Diabetes:  Current medications: Jardiance  25mg  daily, Glipizide  10mg  + 5mg  at night, Actos  30mg  daily, Rybelsus  14mg , Lantus  10U daily (taking with other meds) Medications tried in the past: Metformin  (contributing to leg issues)  Current glucose readings from 01/20/24: 200's (getting lower though per patient) Current glucose readings from 12/22/23: 200's (occasional 170 per patient) Current glucose readings from 10/13/23 visit: 170-180 (reports the readings are coming down) Current glucose readings from 09/26/23 visit: 145 lowest, 215 for 30 minutes after eating (9:30AM) Using One Touch meter; testing 1 times daily  Patient denies hypoglycemic s/sx including dizziness, shakiness, sweating. Patient denies hyperglycemic symptoms including polyuria, polydipsia, polyphagia, nocturia, neuropathy, blurred vision.  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 [employer])   Objective:  Lab Results  Component Value Date   HGBA1C 13.8 (H) 12/26/2023    Lab Results  Component Value Date   CREATININE 1.46 (H) 12/26/2023   BUN 46 (H) 12/26/2023   NA 133 (L) 12/26/2023   K 4.8 12/26/2023   CL 101 12/26/2023   CO2 15 (L) 12/26/2023    Lab Results  Component Value Date   CHOL 245 (H) 12/26/2023   HDL 62 12/26/2023   LDLCALC 165 (H) 12/26/2023   TRIG 103 12/26/2023   CHOLHDL 4.0 12/26/2023    Medications Reviewed Today   Medications were not reviewed in this encounter       Assessment/Plan:   Diabetes: - Currently uncontrolled - Reviewed long term cardiovascular and renal outcomes of uncontrolled blood sugar - Reviewed goal A1c, goal fasting, and goal 2 hour post prandial glucose - Patient has added some significant diet changes since out of her depressive phase- admits to discontinuing all of her medications prior to see which is working - Recommend to check glucose 1x daily- recommended checking prior to eating   Hyperlipidemia: - Continue Rosuvastatin - leg pains are from severely low Vitamin D  levels likely   Follow Up Plan:  - Follow-up on 02/10/24 to see how BG is doing - Reports getting woozy and "yellow vision" and needing to lay down  after taking insulin- taking all meds at same time  - Advised to separate insulin timing from other meds by about 12 hours if possible to help prevent this  - Has not checked BG when this occurs to see if it is low BG occurring - Therefore, today is last day with insurance- will  see if able to get meds toda  - Working on getting new insurance plan- has call later today to discuss - Will submit CGM script once Medicare plan is determined - Called pharmacy and all scripts will be filled today- needed refill on Glipizide  5mg  only  *01/20/24 is the patient's last day at work- officially retiring!!   Kenneth Peace, PharmD [facility] Phone Number: [phone]

## 2024-01-23 NOTE — Telephone Encounter (Signed)
 Reordered 01/20/24 #90  Requested Prescriptions  Refused Prescriptions Disp Refills   glipiZIDE  (GLUCOTROL ) 5 MG tablet [Pharmacy Med Name: glipiZIDE  5 MG Oral Tablet] 90 tablet 0    Sig: TAKE 1 TABLET BY MOUTH TWICE DAILY BEFORE A MEAL     There is no refill protocol information for this order

## 2024-02-10 ENCOUNTER — Other Ambulatory Visit: Payer: Self-pay | Admitting: Pharmacist

## 2024-02-10 NOTE — Progress Notes (Signed)
 02/10/2024 Name: Kelly Manning MRN: [id number] DOB: 1956-04-15  Chief Complaint  Patient presents with   Medication Management    Kelly Manning is a 68 y.o. year old female who presented for a telephone visit.   They were referred to the pharmacist by their PCP for assistance in managing diabetes.    Subjective:  Care Team: Primary Care Provider: Mimi Alt, MD ; Next Scheduled Visit: 03/24/24 Clinical Pharmacist: Delvin File, PharmD  Medication Access/Adherence  Current Pharmacy:  [facility] [address] [place] - [address] [place] [address] Phone: [phone] Fax: [phone]   Patient reports affordability concerns with their medications: No  Patient reports access/transportation concerns to their pharmacy: No  Patient reports adherence concerns with their medications:  No    Schedule: Works from [employer] to [employer]- drives about 1.5 hrs to get there Sleeps at 10AM-4PM  Leaves at 5:30PM **Call patient at 4-5PM or 10AM  Diabetes:  Current medications: Jardiance  25mg  daily, Glipizide  10mg  + 5mg  at night, Actos  30mg  daily, Rybelsus  14mg , Lantus  10U daily (taking with other meds) Medications tried in the past: Metformin  (contributing to leg issues)  Current glucose readings from 01/20/24: 200's (getting lower though per patient) Current glucose readings from 12/22/23: 200's (occasional 170 per patient) Current glucose readings from 10/13/23 visit: 170-180 (reports the readings are coming down) Current glucose readings from 09/26/23 visit: 145 lowest, 215 for 30 minutes after eating (9:30AM) Using One Touch meter; testing 1 times daily  Patient denies hypoglycemic s/sx including dizziness, shakiness, sweating. Patient denies hyperglycemic symptoms including polyuria, polydipsia, polyphagia, nocturia, neuropathy, blurred vision.  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 [employer])   Objective:  Lab Results  Component Value Date   HGBA1C 13.8 (H) 12/26/2023    Lab Results  Component Value Date   CREATININE 1.46 (H) 12/26/2023   BUN 46 (H) 12/26/2023   NA 133 (L) 12/26/2023   K 4.8 12/26/2023   CL 101 12/26/2023   CO2 15 (L) 12/26/2023    Lab Results  Component Value Date   CHOL 245 (H) 12/26/2023   HDL 62 12/26/2023   LDLCALC 165 (H) 12/26/2023   TRIG 103 12/26/2023   CHOLHDL 4.0 12/26/2023    Medications Reviewed Today   Medications were not reviewed in this encounter       Assessment/Plan:   Diabetes: - Currently uncontrolled - Reviewed long term cardiovascular and renal outcomes of uncontrolled blood sugar - Reviewed goal A1c, goal fasting, and goal 2 hour post prandial glucose - Patient has added some significant diet changes since out of her depressive phase- admits to discontinuing all of her medications prior to see which is working - Recommend to check glucose 1x daily- recommended checking prior to eating   Hyperlipidemia: - Continue Rosuvastatin - leg pains are from severely low Vitamin D  levels likely   Follow Up Plan:  - Follow-up on 02/24/24 to discuss pricing options for medication  - Message sent to [facility] about [facility]- however  NONE of her meds are on their list included by the program - Reports getting woozy and "yellow vision" and needing to lay down after taking insulin- taking at separate time of day from other meds now and still occurring  - Has not checked BG when this occurs  to see if it is low BG occurring  - Advised to eat before taking insulin- patient prefers to keep current dose and continue to see if her  body just needs to continue getting used to it - Will submit CGM script once Medicare plan is determined - Needs Glipizide  5mg  ($9) andVitamin D   ($11.32= 8 caps) script today- call [facility]; also ask about Rybelsus  samples at the office  - [facility] and they are preparing the scripts now  Med pricing for 30DS: - Glipizide  5mg : $9 (60DS); $5 at [facility] - Glipizide  10mg : $4; $10 at [facility] - Lisinopril  5mg : $9; $5 at [facility] - Pioglitazone  30mg : $18 w/ GoodRx; $10 at [facility] - Rosuvastatin  20mg : $16.33 w/ GoodRx; $5 at [facility]  [facility]= $56.33 [facility]= $35  - Lantus : Good for now - Jardiance  + Rybelsus : Samples   *Retired on 01/20/24- waiting for Medicare insurance to get set-up  Update from 02/13/24: - Advised no samples for Rybelsus  or Jardiance  are available at the office - Should qualify for Novo Nordisk PAP for Rybelsus  and insulin - Still waiting on Medicare A/B card in the mail currently - Will submit application today    Delvin File, PharmD [facility] Phone Number: [phone]

## 2024-02-24 ENCOUNTER — Other Ambulatory Visit: Payer: Self-pay

## 2024-02-24 ENCOUNTER — Other Ambulatory Visit: Payer: Self-pay | Admitting: Pharmacist

## 2024-02-24 DIAGNOSIS — E1165 Type 2 diabetes mellitus with hyperglycemia: Secondary | ICD-10-CM

## 2024-02-24 DIAGNOSIS — I10 Essential (primary) hypertension: Secondary | ICD-10-CM

## 2024-02-24 DIAGNOSIS — E1169 Type 2 diabetes mellitus with other specified complication: Secondary | ICD-10-CM

## 2024-02-24 MED ORDER — LISINOPRIL 5 MG PO TABS
5.0000 mg | ORAL_TABLET | Freq: Every day | ORAL | 2 refills | Status: DC
  Filled 2024-02-24: qty 30, 30d supply, fill #0
  Filled 2024-04-05: qty 30, 30d supply, fill #1
  Filled 2024-05-28: qty 30, 30d supply, fill #2

## 2024-02-24 MED ORDER — PIOGLITAZONE HCL 30 MG PO TABS
30.0000 mg | ORAL_TABLET | Freq: Every day | ORAL | 2 refills | Status: DC
  Filled 2024-02-24: qty 30, 30d supply, fill #0
  Filled 2024-04-05: qty 30, 30d supply, fill #1
  Filled 2024-05-28: qty 30, 30d supply, fill #2

## 2024-02-24 MED ORDER — GLIPIZIDE 10 MG PO TABS
10.0000 mg | ORAL_TABLET | Freq: Every day | ORAL | 2 refills | Status: DC
  Filled 2024-02-24: qty 30, 30d supply, fill #0
  Filled 2024-04-05: qty 30, 30d supply, fill #1
  Filled 2024-05-28: qty 30, 30d supply, fill #2

## 2024-02-24 NOTE — Progress Notes (Signed)
 02/24/2024 Name: Kelly Manning MRN: [id number] DOB: Mar 20, 1956  Chief Complaint  Patient presents with   Medication Management    Kelly Manning is a 68 y.o. year old female who presented for a telephone visit.   They were referred to the pharmacist by their PCP for assistance in managing diabetes.    Subjective:  Care Team: Primary Care Provider: Mimi Alt, MD ; Next Scheduled Visit: 03/24/24 Clinical Pharmacist: Delvin File, PharmD  Medication Access/Adherence  Current Pharmacy:  [facility] [address] [place] - [address] [place] [address] Phone: [phone] Fax: [phone]   Patient reports affordability concerns with their medications: No  Patient reports access/transportation concerns to their pharmacy: No  Patient reports adherence concerns with their medications:  No    Schedule: Works from [employer] to [employer]- drives about 1.5 hrs to get there Sleeps at 10AM-4PM  Leaves at 5:30PM **Call patient at 4-5PM or 10AM  Diabetes:  Current medications: Jardiance  25mg  daily, Glipizide  10mg  + 5mg  at night, Actos  30mg  daily, Rybelsus  14mg , Lantus  10U daily (taking with other meds) Medications tried in the past: Metformin  (contributing to leg issues)  Current glucose readings from 01/20/24: 200's (getting lower though per patient) Current glucose readings from 12/22/23: 200's (occasional 170 per patient) Current glucose readings from 10/13/23 visit: 170-180 (reports the readings are coming down) Current glucose readings from 09/26/23 visit: 145 lowest, 215 for 30 minutes after eating (9:30AM) Using One Touch meter; testing 1 times daily  Patient denies hypoglycemic s/sx including dizziness, shakiness, sweating. Patient denies hyperglycemic symptoms including polyuria, polydipsia, polyphagia, nocturia, neuropathy, blurred vision.  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 [employer])   Objective:  Lab Results  Component Value Date   HGBA1C 13.8 (H) 12/26/2023    Lab Results  Component Value Date   CREATININE 1.46 (H) 12/26/2023   BUN 46 (H) 12/26/2023   NA 133 (L) 12/26/2023   K 4.8 12/26/2023   CL 101 12/26/2023   CO2 15 (L) 12/26/2023    Lab Results  Component Value Date   CHOL 245 (H) 12/26/2023   HDL 62 12/26/2023   LDLCALC 165 (H) 12/26/2023   TRIG 103 12/26/2023   CHOLHDL 4.0 12/26/2023    Medications Reviewed Today   Medications were not reviewed in this encounter       Assessment/Plan:   Diabetes: - Currently uncontrolled - Reviewed long term cardiovascular and renal outcomes of uncontrolled blood sugar - Reviewed goal A1c, goal fasting, and goal 2 hour post prandial glucose - Patient has added some significant diet changes since out of her depressive phase- admits to discontinuing all of her medications prior to see which is working - Recommend to check glucose 1x daily- recommended checking prior to eating   Hyperlipidemia: - Continue Rosuvastatin - leg pains are from severely low Vitamin D  levels likely   Follow Up Plan:  - Follow-up on 03/16/24 to discuss pricing options for medication  - Message sent to [facility] about [facility]- however  NONE of her meds are on their list included by the program - Patient spaced out medication timing which resolved woozy feeling (takes a few 1 hr after original meds, and insulin 3 hrs later= all within first 4 hrs of waking up) - Will submit CGM script once  Medicare plan is determined - Needs Glipizide  10mg , Lisinopril , and Actos  filled today- sending scripts to [facility] for discount pricing list - Out of Rybelsus  (PAP shipment coming) and good on Jardiance  supply still - Novo Nordisk PAP approved and shipment to arrive at office between 6/5 to 6/11   Med pricing for 30DS: - Glipizide  5mg : $9 (60DS); $5 at [facility] - Glipizide  10mg : $4; $10 at [facility] - Lisinopril  5mg : $9; $5 at [facility] - Pioglitazone  30mg : $18 w/ GoodRx; $10 at [facility] - Rosuvastatin  20mg : $16.33 w/ GoodRx; $5 at [facility]  [facility]= $56.33 [facility]= $35  - Lantus : Good for now - Jardiance  + Rybelsus : PAP   *Retired on 01/20/24- still waiting for Medicare insurance to get set-up   Delvin File, PharmD [facility] Phone Number: [phone]

## 2024-03-05 ENCOUNTER — Telehealth: Payer: Self-pay | Admitting: Family Medicine

## 2024-03-05 NOTE — Telephone Encounter (Signed)
 Informed pt that her Rybelsus (#4) ,Novofine tip(#2) and Tresiba(#4) has arrived and is ready for pick up

## 2024-03-16 ENCOUNTER — Other Ambulatory Visit: Payer: Self-pay | Admitting: Pharmacist

## 2024-03-16 ENCOUNTER — Telehealth: Payer: Self-pay | Admitting: Pharmacist

## 2024-03-16 NOTE — Progress Notes (Addendum)
   03/16/2024  Patient ID: Erminio MARLA Ng, female   DOB: 19-Nov-1955, 68 y.o.   MRN: [id number]  Attempted to contact patient for scheduled appointment for medication management. Left HIPAA compliant message for patient to return my call at their convenience.    Update from 03/30/24:  Patient reports having all of her medications at this time except the Rosuvastatin . Picked up her PAP from the office already. Called pharmacy today to have rosuvastatin  refilled.  Will need the other 3 medications in about a week.   Reports having [place] White[employer] Medicare card now. Will have to wait 1 week before she can apply for Medicare Advantage or Part D according to her insurance agent  Apologized for having to cancel visit, but will reschedule once insurance issue is resolved  Follow-up in 1 month for medication review and insurance status   Aloysius Lewis, PharmD [facility]  Phone Number: [phone]

## 2024-03-24 ENCOUNTER — Ambulatory Visit: Admitting: Family Medicine

## 2024-04-05 ENCOUNTER — Other Ambulatory Visit: Payer: Self-pay

## 2024-04-09 ENCOUNTER — Other Ambulatory Visit: Payer: Self-pay | Admitting: Family Medicine

## 2024-04-09 ENCOUNTER — Other Ambulatory Visit: Payer: Self-pay

## 2024-04-09 DIAGNOSIS — E1169 Type 2 diabetes mellitus with other specified complication: Secondary | ICD-10-CM

## 2024-04-09 MED ORDER — ROSUVASTATIN CALCIUM 20 MG PO TABS
20.0000 mg | ORAL_TABLET | Freq: Every day | ORAL | 3 refills | Status: AC
  Filled 2024-04-09: qty 90, 90d supply, fill #0
  Filled 2024-08-11: qty 90, 90d supply, fill #1

## 2024-04-23 ENCOUNTER — Other Ambulatory Visit: Payer: Self-pay

## 2024-04-27 ENCOUNTER — Telehealth: Payer: Self-pay | Admitting: Pharmacist

## 2024-04-27 ENCOUNTER — Other Ambulatory Visit: Payer: Self-pay | Admitting: Family Medicine

## 2024-04-27 DIAGNOSIS — E1165 Type 2 diabetes mellitus with hyperglycemia: Secondary | ICD-10-CM

## 2024-04-27 DIAGNOSIS — E1169 Type 2 diabetes mellitus with other specified complication: Secondary | ICD-10-CM

## 2024-04-27 MED ORDER — GLIPIZIDE 5 MG PO TABS: 5.0000 mg | ORAL_TABLET | Freq: Two times a day (BID) | ORAL | 0 refills | Status: AC

## 2024-04-27 NOTE — Progress Notes (Addendum)
   04/27/2024  Patient ID: Kelly Manning, female   DOB: [birth year], 68 y.o.   MRN: [id number]  Attempted to contact patient for scheduled appointment for medication management. Left HIPAA compliant message for patient to return my call at their convenience.    Appears patient currently has all of the medications she needs, except the Glipizide  5mg . Patient needs a refill to continue this medication. Rx sent to the pharmacy.  Patient continues to have issues getting her medication insurance according to the chart review.  Will have new clinic pharmacist follow-up with her.  Update from 8/12: Attempt #2. Left another voicemail requesting call back at earliest convenience for brief medication review. No insurance still per chart. Will complete last attempt in 1 week.    Aloysius Lewis, PharmD [facility]  Phone Number: [phone]

## 2024-05-28 ENCOUNTER — Other Ambulatory Visit: Payer: Self-pay

## 2024-05-28 MED FILL — Empagliflozin Tab 25 MG: ORAL | 30 days supply | Qty: 30 | Fill #0 | Status: CN

## 2024-06-02 ENCOUNTER — Other Ambulatory Visit: Payer: Self-pay

## 2024-06-02 MED FILL — Empagliflozin Tab 25 MG: ORAL | 30 days supply | Qty: 30 | Fill #0 | Status: AC

## 2024-06-02 MED FILL — Empagliflozin Tab 25 MG: ORAL | 30 days supply | Qty: 30 | Fill #0 | Status: CN

## 2024-08-11 ENCOUNTER — Other Ambulatory Visit: Payer: Self-pay

## 2024-08-11 ENCOUNTER — Other Ambulatory Visit: Payer: Self-pay | Admitting: Family Medicine

## 2024-08-11 DIAGNOSIS — E1165 Type 2 diabetes mellitus with hyperglycemia: Secondary | ICD-10-CM

## 2024-08-11 DIAGNOSIS — I10 Essential (primary) hypertension: Secondary | ICD-10-CM

## 2024-08-11 DIAGNOSIS — E1169 Type 2 diabetes mellitus with other specified complication: Secondary | ICD-10-CM

## 2024-08-12 ENCOUNTER — Other Ambulatory Visit: Payer: Self-pay

## 2024-08-12 ENCOUNTER — Other Ambulatory Visit: Payer: Self-pay | Admitting: Family Medicine

## 2024-08-12 DIAGNOSIS — E1165 Type 2 diabetes mellitus with hyperglycemia: Secondary | ICD-10-CM

## 2024-08-12 DIAGNOSIS — E1169 Type 2 diabetes mellitus with other specified complication: Secondary | ICD-10-CM

## 2024-08-12 DIAGNOSIS — I10 Essential (primary) hypertension: Secondary | ICD-10-CM

## 2024-08-13 ENCOUNTER — Other Ambulatory Visit: Payer: Self-pay

## 2024-08-13 MED ORDER — PIOGLITAZONE HCL 30 MG PO TABS
30.0000 mg | ORAL_TABLET | Freq: Every day | ORAL | 2 refills | Status: AC
Start: 2024-08-13 — End: ?
  Filled 2024-08-13: qty 30, 30d supply, fill #0

## 2024-08-13 MED FILL — Glipizide Tab 10 MG: ORAL | 30 days supply | Qty: 30 | Fill #0 | Status: AC

## 2024-08-13 MED FILL — Empagliflozin Tab 25 MG: ORAL | 90 days supply | Qty: 90 | Fill #0 | Status: AC

## 2024-08-13 MED FILL — Lisinopril Tab 5 MG: ORAL | 30 days supply | Qty: 30 | Fill #0 | Status: AC

## 2024-08-16 ENCOUNTER — Other Ambulatory Visit (HOSPITAL_COMMUNITY): Payer: Self-pay

## 2024-08-16 ENCOUNTER — Other Ambulatory Visit: Payer: Self-pay

## 2024-08-23 ENCOUNTER — Telehealth: Payer: Self-pay

## 2024-08-23 NOTE — Telephone Encounter (Signed)
 Gave pt a call,pt is coming up due for re-enrollment on Novo Nordisk (Tresiba/pen needles) spoke with pt explain she is not sure if she will continue using this medication,pt explain she will go over with provider and will call back if she needs PAP done for 2026.

## 2024-08-27 ENCOUNTER — Other Ambulatory Visit (HOSPITAL_COMMUNITY): Payer: Self-pay

## 2024-10-13 ENCOUNTER — Telehealth: Payer: Self-pay

## 2024-10-13 NOTE — Telephone Encounter (Signed)
 I attempted to contact Kelly Manning to inform her to stop by the office before 10/15/24 due to the potential inclement weather and no generator on site. I left a detailed message
